# Patient Record
Sex: Male | Born: 1950 | Hispanic: No
Health system: Southern US, Community
[De-identification: ages and names within clinical notes are randomized; demographics above are authoritative.]

---

## 2020-08-30 ENCOUNTER — Other Ambulatory Visit: Payer: Self-pay

## 2020-08-30 ENCOUNTER — Inpatient Hospital Stay (HOSPITAL_BASED_OUTPATIENT_CLINIC_OR_DEPARTMENT_OTHER)
Admission: EM | Admit: 2020-08-30 | Discharge: 2020-09-01 | DRG: 641 | Disposition: A | Discharge status: Home / Self Care | Payer: Medicare Other | Attending: Internal Medicine | Admitting: Internal Medicine

## 2020-08-30 ENCOUNTER — Emergency Department (HOSPITAL_BASED_OUTPATIENT_CLINIC_OR_DEPARTMENT_OTHER): Payer: Medicare Other

## 2020-08-30 DIAGNOSIS — E861 Hypovolemia: Secondary | ICD-10-CM | POA: Diagnosis present

## 2020-08-30 DIAGNOSIS — E7800 Pure hypercholesterolemia, unspecified: Secondary | ICD-10-CM | POA: Diagnosis present

## 2020-08-30 DIAGNOSIS — E860 Dehydration: Principal | ICD-10-CM | POA: Diagnosis present

## 2020-08-30 DIAGNOSIS — E042 Nontoxic multinodular goiter: Secondary | ICD-10-CM | POA: Diagnosis not present

## 2020-08-30 DIAGNOSIS — Z8042 Family history of malignant neoplasm of prostate: Secondary | ICD-10-CM | POA: Diagnosis not present

## 2020-08-30 DIAGNOSIS — T464X5A Adverse effect of angiotensin-converting-enzyme inhibitors, initial encounter: Secondary | ICD-10-CM | POA: Diagnosis present

## 2020-08-30 DIAGNOSIS — E1122 Type 2 diabetes mellitus with diabetic chronic kidney disease: Secondary | ICD-10-CM | POA: Diagnosis not present

## 2020-08-30 DIAGNOSIS — Z20822 Contact with and (suspected) exposure to covid-19: Secondary | ICD-10-CM | POA: Diagnosis not present

## 2020-08-30 DIAGNOSIS — E785 Hyperlipidemia, unspecified: Secondary | ICD-10-CM

## 2020-08-30 DIAGNOSIS — Z8249 Family history of ischemic heart disease and other diseases of the circulatory system: Secondary | ICD-10-CM

## 2020-08-30 DIAGNOSIS — M5416 Radiculopathy, lumbar region: Secondary | ICD-10-CM | POA: Diagnosis present

## 2020-08-30 DIAGNOSIS — M48061 Spinal stenosis, lumbar region without neurogenic claudication: Secondary | ICD-10-CM | POA: Diagnosis present

## 2020-08-30 DIAGNOSIS — R55 Syncope and collapse: Secondary | ICD-10-CM | POA: Diagnosis present

## 2020-08-30 DIAGNOSIS — M79605 Pain in left leg: Secondary | ICD-10-CM | POA: Diagnosis not present

## 2020-08-30 DIAGNOSIS — N1831 Chronic kidney disease, stage 3a: Secondary | ICD-10-CM | POA: Diagnosis present

## 2020-08-30 DIAGNOSIS — R52 Pain, unspecified: Secondary | ICD-10-CM

## 2020-08-30 DIAGNOSIS — M79604 Pain in right leg: Secondary | ICD-10-CM | POA: Diagnosis present

## 2020-08-30 DIAGNOSIS — N400 Enlarged prostate without lower urinary tract symptoms: Secondary | ICD-10-CM | POA: Diagnosis not present

## 2020-08-30 DIAGNOSIS — Z7901 Long term (current) use of anticoagulants: Secondary | ICD-10-CM

## 2020-08-30 DIAGNOSIS — Z86718 Personal history of other venous thrombosis and embolism: Secondary | ICD-10-CM | POA: Diagnosis not present

## 2020-08-30 DIAGNOSIS — I129 Hypertensive chronic kidney disease with stage 1 through stage 4 chronic kidney disease, or unspecified chronic kidney disease: Secondary | ICD-10-CM | POA: Diagnosis present

## 2020-08-30 DIAGNOSIS — H538 Other visual disturbances: Secondary | ICD-10-CM | POA: Diagnosis present

## 2020-08-30 DIAGNOSIS — I10 Essential (primary) hypertension: Secondary | ICD-10-CM

## 2020-08-30 DIAGNOSIS — R748 Abnormal levels of other serum enzymes: Secondary | ICD-10-CM | POA: Diagnosis present

## 2020-08-30 DIAGNOSIS — E041 Nontoxic single thyroid nodule: Secondary | ICD-10-CM

## 2020-08-30 DIAGNOSIS — M5430 Sciatica, unspecified side: Secondary | ICD-10-CM | POA: Diagnosis not present

## 2020-08-30 DIAGNOSIS — E119 Type 2 diabetes mellitus without complications: Secondary | ICD-10-CM

## 2020-08-30 DIAGNOSIS — N179 Acute kidney failure, unspecified: Secondary | ICD-10-CM

## 2020-08-30 DIAGNOSIS — N189 Chronic kidney disease, unspecified: Secondary | ICD-10-CM

## 2020-08-30 DIAGNOSIS — R252 Cramp and spasm: Secondary | ICD-10-CM | POA: Diagnosis present

## 2020-08-30 LAB — CBC WITH DIFFERENTIAL/PLATELET
Abs Immature Granulocytes: 0.01 10*3/uL (ref 0.00–0.07)
Basophils Absolute: 0 10*3/uL (ref 0.0–0.1)
Basophils Relative: 1 %
Eosinophils Absolute: 0.1 10*3/uL (ref 0.0–0.5)
Eosinophils Relative: 2 %
HCT: 40.5 % (ref 39.0–52.0)
Hemoglobin: 13.3 g/dL (ref 13.0–17.0)
Immature Granulocytes: 0 %
Lymphocytes Relative: 15 %
Lymphs Abs: 0.8 10*3/uL (ref 0.7–4.0)
MCH: 27 pg (ref 26.0–34.0)
MCHC: 32.8 g/dL (ref 30.0–36.0)
MCV: 82.3 fL (ref 80.0–100.0)
Monocytes Absolute: 0.5 10*3/uL (ref 0.1–1.0)
Monocytes Relative: 9 %
Neutro Abs: 4.1 10*3/uL (ref 1.7–7.7)
Neutrophils Relative %: 73 %
Platelets: 226 10*3/uL (ref 150–400)
RBC: 4.92 MIL/uL (ref 4.22–5.81)
RDW: 14.2 % (ref 11.5–15.5)
WBC: 5.5 10*3/uL (ref 4.0–10.5)
nRBC: 0 % (ref 0.0–0.2)

## 2020-08-30 LAB — TROPONIN I (HIGH SENSITIVITY)
Troponin I (High Sensitivity): 5 ng/L (ref <18)
Troponin I (High Sensitivity): 5 ng/L (ref <18)

## 2020-08-30 LAB — COMPREHENSIVE METABOLIC PANEL
ALT: 20 U/L (ref 0–44)
AST: 24 U/L (ref 15–41)
Albumin: 4.6 g/dL (ref 3.5–5.0)
Alkaline Phosphatase: 56 U/L (ref 38–126)
Anion gap: 10 (ref 5–15)
BUN: 39 mg/dL — ABNORMAL HIGH (ref 8–23)
CO2: 27 mmol/L (ref 22–32)
Calcium: 10.3 mg/dL (ref 8.9–10.3)
Chloride: 98 mmol/L (ref 98–111)
Creatinine, Ser: 2.68 mg/dL — ABNORMAL HIGH (ref 0.61–1.24)
GFR, Estimated: 25 mL/min — ABNORMAL LOW (ref >60)
Glucose, Bld: 115 mg/dL — ABNORMAL HIGH (ref 70–99)
Potassium: 4.8 mmol/L (ref 3.5–5.1)
Sodium: 135 mmol/L (ref 135–145)
Total Bilirubin: 0.7 mg/dL (ref 0.3–1.2)
Total Protein: 7.5 g/dL (ref 6.5–8.1)

## 2020-08-30 LAB — LACTIC ACID, PLASMA
Lactic Acid, Venous: 1.2 mmol/L (ref 0.5–1.9)
Lactic Acid, Venous: 2.6 mmol/L (ref 0.5–1.9)

## 2020-08-30 LAB — URINALYSIS, ROUTINE W REFLEX MICROSCOPIC
Bilirubin Urine: NEGATIVE
Glucose, UA: NEGATIVE mg/dL
Hgb urine dipstick: NEGATIVE
Ketones, ur: NEGATIVE mg/dL
Leukocytes,Ua: NEGATIVE
Nitrite: NEGATIVE
Protein, ur: 30 mg/dL — AB
Specific Gravity, Urine: 1.025 (ref 1.005–1.030)
pH: 5 (ref 5.0–8.0)

## 2020-08-30 LAB — RESP PANEL BY RT-PCR (FLU A&B, COVID) ARPGX2
Influenza A by PCR: NEGATIVE
Influenza B by PCR: NEGATIVE
SARS Coronavirus 2 by RT PCR: NEGATIVE

## 2020-08-30 LAB — LIPASE, BLOOD: Lipase: 31 U/L (ref 11–51)

## 2020-08-30 LAB — MAGNESIUM: Magnesium: 1.9 mg/dL (ref 1.7–2.4)

## 2020-08-30 LAB — CK: Total CK: 912 U/L — ABNORMAL HIGH (ref 49–397)

## 2020-08-30 MED ORDER — SODIUM CHLORIDE 0.9 % IV BOLUS
1000.0000 mL | Freq: Once | INTRAVENOUS | Status: AC
  Administered 2020-08-30: 1000 mL via INTRAVENOUS

## 2020-08-30 MED ORDER — SODIUM CHLORIDE 0.9 % IV SOLN: Freq: Once | INTRAVENOUS | Status: AC

## 2020-08-30 NOTE — ED Triage Notes (Signed)
He states he has had multiple "back injections" for back pain, the most recent of which was ~ 3 weeks ago. He states his back pain is better, but now he is having bilat. Leg "cramps". He also states he had a brief possible syncope today which sitting in his chair. He is in no distress.

## 2020-08-30 NOTE — H&P (Signed)
History and Physical    Calvin Harris UXL:[id number] DOB: [birth year] DOA: 08/30/2020  PCP: Pcp, No Patient coming from: [facility]  Chief Complaint: Syncope  HPI: Calvin Harris is a 70 y.o. male with medical history significant of hypertension, hyperlipidemia, history of DVT on Eliquis, type II diabetes, CKD stage III AAA, lumbar radiculopathy, BPH, erectile dysfunction presented to the ED after a witnessed syncopal event with no prodrome.  Also complained of cramping in his abdomen and legs.  In the ED, vital signs stable.  Labs showing WBC 5.5, hemoglobin 13.3, platelet count 226K.  Sodium 135, potassium 4.8, chloride 98, bicarb 27, BUN 39, creatinine 2.6, glucose 115.  Lipase and LFTs normal.  Magnesium 1.9.  EKG showing sinus rhythm.  High-sensitivity troponin negative x2.  CK 912.  UA not suggestive of infection.  Lactic acid 1.2.  COVID and influenza PCR negative.  Head CT negative for acute finding.  CT chest/abdomen/pelvis negative for acute finding. Patient was given 1 L normal saline bolus.  Patient states he has had on and off episodes of dizziness and blurred vision for several months.  He thinks it is due to him not drinking enough fluids.  Today he felt dizzy all day and while at a family gathering he felt like he was going to blackout and had to sit down.  He does not remember what happened but was told by family that after sitting down on a chair he was just staring and then became unresponsive.  He denies any preceding shortness of breath or chest pain.  Denies history of seizures or stroke.  He is also complaining of ongoing cramps in his lower abdomen and upper thighs for several months.  States he has problems with his lower back including sciatica and spinal stenosis for which he gets injections.  Sometimes his legs cramp when he changes position but sometimes even at rest.  The cramping is mostly in his upper thighs but sometimes he feels it in his calves as well.  He has  noticed that during sexual activity specially after he orgasms he feels cramping pain in his upper inner thighs.  Denies history of peripheral arterial disease.  Does report history of prior DVTs in his right leg for which he is on Eliquis and compliant.  No swelling of his legs.  Review of Systems:  All systems reviewed and apart from history of presenting illness, are negative.  Past medical history: See HPI.  Past surgical history: Appendectomy in 2004, prostate surgery for BPH  Social history: Drinks 2 shots of liquor per week.  No drug or tobacco use.  Family history: Heart disease (mother), lung and prostate cancer (father)  Allergies: NKDA  Prior to Admission medications   Not on File    Physical Exam: Vitals:   08/30/20 2115 08/30/20 2125 08/30/20 2130 08/30/20 2244  BP: 137/86  131/83 126/82  Pulse: 71  73 81  Resp: 16  14 16   Temp:   98.2 F (36.8 C) 98 F (36.7 C)  TempSrc:   Oral Oral  SpO2: 100%  97% 97%  Weight:  92.5 kg    Height:  5\' 11"  (1.803 m)      Physical Exam Constitutional:      General: He is not in acute distress. HENT:     Head: Normocephalic and atraumatic.  Eyes:     Extraocular Movements: Extraocular movements intact.     Conjunctiva/sclera: Conjunctivae normal.  Cardiovascular:     Rate and  Rhythm: Normal rate and regular rhythm.     Pulses: Normal pulses.  Pulmonary:     Effort: Pulmonary effort is normal. No respiratory distress.     Breath sounds: Normal breath sounds. No wheezing or rales.  Abdominal:     General: Bowel sounds are normal. There is no distension.     Palpations: Abdomen is soft.     Tenderness: There is no abdominal tenderness.  Musculoskeletal:        General: No swelling or tenderness.     Cervical back: Normal range of motion and neck supple.  Skin:    General: Skin is warm and dry.  Neurological:     General: No focal deficit present.     Mental Status: He is alert and oriented to person, place, and time.      Labs on Admission: I have personally reviewed following labs and imaging studies  CBC: Recent Labs  Lab 08/30/20 1740  WBC 5.5  NEUTROABS 4.1  HGB 13.3  HCT 40.5  MCV 82.3  PLT 226   Basic Metabolic Panel: Recent Labs  Lab 08/30/20 1740  NA 135  K 4.8  CL 98  CO2 27  GLUCOSE 115*  BUN 39*  CREATININE 2.68*  CALCIUM 10.3  MG 1.9   GFR: Estimated Creatinine Clearance: 29.8 mL/min (A) (by C-G formula based on SCr of 2.68 mg/dL (H)). Liver Function Tests: Recent Labs  Lab 08/30/20 1740  AST 24  ALT 20  ALKPHOS 56  BILITOT 0.7  PROT 7.5  ALBUMIN 4.6   Recent Labs  Lab 08/30/20 1740  LIPASE 31   No results for input(s): AMMONIA in the last 168 hours. Coagulation Profile: No results for input(s): INR, PROTIME in the last 168 hours. Cardiac Enzymes: Recent Labs  Lab 08/30/20 1740  CKTOTAL 912*   BNP (last 3 results) No results for input(s): PROBNP in the last 8760 hours. HbA1C: No results for input(s): HGBA1C in the last 72 hours. CBG: No results for input(s): GLUCAP in the last 168 hours. Lipid Profile: No results for input(s): CHOL, HDL, LDLCALC, TRIG, CHOLHDL, LDLDIRECT in the last 72 hours. Thyroid Function Tests: No results for input(s): TSH, T4TOTAL, FREET4, T3FREE, THYROIDAB in the last 72 hours. Anemia Panel: No results for input(s): VITAMINB12, FOLATE, FERRITIN, TIBC, IRON, RETICCTPCT in the last 72 hours. Urine analysis:    Component Value Date/Time   COLORURINE YELLOW 08/30/2020 1740   APPEARANCEUR HAZY (A) 08/30/2020 1740   LABSPEC 1.025 08/30/2020 1740   PHURINE 5.0 08/30/2020 1740   GLUCOSEU NEGATIVE 08/30/2020 1740   HGBUR NEGATIVE 08/30/2020 1740   BILIRUBINUR NEGATIVE 08/30/2020 1740   KETONESUR NEGATIVE 08/30/2020 1740   PROTEINUR 30 (A) 08/30/2020 1740   NITRITE NEGATIVE 08/30/2020 1740   LEUKOCYTESUR NEGATIVE 08/30/2020 1740    Radiological Exams on Admission: CT Head Wo Contrast  Result Date: 08/30/2020 CLINICAL  DATA:  Possible syncopal episode today. EXAM: CT HEAD WITHOUT CONTRAST TECHNIQUE: Contiguous axial images were obtained from the base of the skull through the vertex without intravenous contrast. COMPARISON:  None. FINDINGS: Brain: Mildly enlarged ventricles and cortical sulci. Mild patchy white matter low density in both cerebral hemispheres. No intracranial hemorrhage, mass lesion or CT evidence of acute infarction. Vascular: No hyperdense vessel or unexpected calcification. Skull: Normal. Negative for fracture or focal lesion. Sinuses/Orbits: Unremarkable. Other: Mild bilateral concha bullosa. IMPRESSION: 1. No acute abnormality. 2. Mild diffuse cerebral atrophy and mild chronic small vessel white matter ischemic changes in both cerebral hemispheres.  Electronically Signed   By: Beckie Salts M.D.   On: 08/30/2020 20:16   CT CHEST ABDOMEN PELVIS WO CONTRAST  Result Date: 08/30/2020 CLINICAL DATA:  Bilateral leg pain. Recent low back pain. Renal insufficiency. EXAM: CT CHEST, ABDOMEN AND PELVIS WITHOUT CONTRAST TECHNIQUE: Multidetector CT imaging of the chest, abdomen and pelvis was performed following the standard protocol without IV contrast. COMPARISON:  None. FINDINGS: CT CHEST FINDINGS Cardiovascular: Atheromatous calcifications, including the coronary arteries and aorta. Normal sized heart. No displaced intimal calcifications to indicate aortic dissection. No aneurysm. Mediastinum/Nodes: Mild to moderate diffuse enlargement of the thyroid gland with diffuse heterogeneity and nodularity. The largest discrete nodule is on the right measuring 1.7 cm in maximum diameter on image number 7/3. There is also a more confluent area of nodularity more inferiorly on the right, difficult to determine if there is a single nodule or confluence of smaller nodules measuring 3.4 cm in maximum diameter on image number 9/3. No enlarged lymph nodes.  Unremarkable esophagus. Lungs/Pleura: 1.0 cm calcified granuloma in the right  lower lobe on image number 76/5. No noncalcified nodules. No airspace consolidation or pleural fluid. Musculoskeletal: Thoracic spine degenerative changes. CT ABDOMEN PELVIS FINDINGS Hepatobiliary: Small left lobe liver calcified granuloma. Normal appearing gallbladder. Pancreas: Unremarkable. No pancreatic ductal dilatation or surrounding inflammatory changes. Spleen: Normal in size without focal abnormality. Adrenals/Urinary Tract: Normal appearing adrenal glands. Simple appearing upper pole right renal cyst. Normal appearing left kidney, ureters and urinary bladder with the exception of mild bladder wall thickening and enlarged prostate gland protruding into the base of the bladder. The urinary bladder is poorly distended. Stomach/Bowel: Unremarkable stomach, small bowel and colon. No evidence of appendicitis. Vascular/Lymphatic: Atheromatous arterial calcifications without aneurysm. No abdominal aortic aneurysm or displaced intimal calcifications to indicate dissection. No enlarged lymph nodes. Reproductive: Moderate to marked enlargement of the prostate gland with protrusion of the median lobe into the base of the urinary bladder. Other: Small bilateral inguinal hernias containing fat. Small umbilical hernia containing fat. Musculoskeletal: L5-S1 degenerative changes with Schmorl's node formation. Bilateral proximal femoral bone islands. IMPRESSION: 1. No acute abnormality. 2. Multinodular goiter with a possible 3.4 cm dominant nodule in the right lobe. Recommend thyroid US (ref: J Am Coll Radiol. 2015 Feb;12(2): 143-50). 3. 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 4. 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 5.  Calcific coronary artery and aortic atherosclerosis. Electronically Signed   By: Beckie Salts M.D.   On: 08/30/2020 20:15    EKG: Independently reviewed.  Sinus  rhythm.  Assessment/Plan Principal Problem:   Syncope Active Problems:   Acute-on-chronic kidney injury (HCC)   Hypertension   Hyperlipidemia   Diabetes (HCC)   Syncope Differentials include cardiogenic, orthostatic, vasovagal, carotid stenosis, or seizure activity.  EKG showing sinus rhythm.  High-sensitivity troponin negative x2.  Head CT negative for acute finding.  PE less likely given no tachycardia or hypoxia. -Cardiac monitoring, echocardiogram, EEG, bilateral carotid Dopplers, D-dimer, orthostatics.  Cramping in lower abdomen/thighs Does have history of lumbar radiculopathy but symptoms are more concerning for claudication.  CT negative for acute intra-abdominal finding. -Bilateral ABIs ordered.  Avoiding contrast arteriography in the setting of AKI.  Also hold statin given elevated CK.  AKI on CKD stage IIIa BUN 39, creatinine 2.6 (baseline 1.2).  Likely prerenal from dehydration.  Also CK slightly elevated. -IV fluid hydration and repeat BMP  in the morning.  Avoid nephrotoxic agents.  Order urinalysis.  Hypertension -Hold antihypertensives at this time, checking orthostatics  Hyperlipidemia -Hold statin given elevated CK  History of recurrent DVTs No clinical signs of DVT at this time. -Resume Eliquis after pharmacy med rec is done  Type 2 diabetes -Pharmacy med rec pending.  Check A1c.  Order sliding scale insulin sensitive ACHS.  BPH -Resume home med after pharmacy med rec is done.  Thyroid nodule Incidental CT finding of multinodular goiter with a possible 3.4 cm dominant nodule in the right lobe. -Thyroid ultrasound  DVT prophylaxis: Resume Eliquis after pharmacy med rec is done Code Status: Patient wishes to be full code. Family Communication: No family available at this time. Disposition Plan: Status is: Observation  The patient remains OBS appropriate and will d/c before 2 midnights.  Dispo: The patient is from: Home              Anticipated d/c is  to: Home              Patient currently is not medically stable to d/c.   Difficult to place patient No  Level of care: Level of care: Telemetry  The medical decision making on this patient was of high complexity and the patient is at high risk for clinical deterioration, therefore this is a level 3 visit.  John Giovanni MD [facility]  If 7PM-7AM, please contact night-coverage [email or web address]  08/31/2020, 12:41 AM

## 2020-08-30 NOTE — Progress Notes (Signed)
Received pt from [facility] to [facility] 6th floor. Pt a/o x 4, no signs of distress noted. PT ambulated from stretcher to bed. VSS. No c/o pain at this time. Oriented pt to room and the use of the call bell. All items placed in reach. Bed alarm initiated. Paged new admissions for further orders.

## 2020-08-30 NOTE — ED Provider Notes (Signed)
[facility] EMERGENCY DEPT Provider Note   CSN: [id number] Arrival date & time: 08/30/20  1627     History Chief Complaint  Patient presents with   Abdominal Pain    Calvin Harris is a 70 y.o. male.  Patient been having intermittent abdominal pain and cramping in going into his legs for the last several days.  Having fatigue and lightheadedness as well.  Was out of family reunion today out in the heat and was sitting down when he had loss of consciousness.  Did not feel it coming on.  Was witnessed.  He did not fall or hit his head.  He continues to have ongoing intermittent cramping in his stomach.  He is on a blood thinner.  The history is provided by the patient.  Abdominal Pain Pain location:  Generalized Pain quality: cramping   Pain radiates to:  L leg and R leg Pain severity:  Moderate Onset quality:  Gradual Duration:  5 days Timing:  Intermittent Progression:  Waxing and waning Chronicity:  New Context: not sick contacts   Relieved by:  Nothing Worsened by:  Nothing Associated symptoms: no chest pain, no chills, no cough, no dysuria, no fever, no hematuria, no shortness of breath, no sore throat and no vomiting       No past medical history on file.  Patient Active Problem List   Diagnosis Date Noted   Syncope 08/30/2020      No family history on file.     Home Medications Prior to Admission medications   Not on File    Allergies    Patient has no allergy information on record.  Review of Systems   Review of Systems  Constitutional:  Negative for chills and fever.  HENT:  Negative for ear pain and sore throat.   Eyes:  Negative for pain and visual disturbance.  Respiratory:  Negative for cough and shortness of breath.   Cardiovascular:  Negative for chest pain and palpitations.  Gastrointestinal:  Positive for abdominal pain. Negative for vomiting.  Genitourinary:  Negative for dysuria and hematuria.  Musculoskeletal:   Positive for myalgias. Negative for arthralgias and back pain.  Skin:  Negative for color change and rash.  Neurological:  Negative for seizures and syncope.  All other systems reviewed and are negative.  Physical Exam Updated Vital Signs BP 125/81   Pulse 71   Temp 97.8 F (36.6 C)   Resp 10   SpO2 99%   Physical Exam Vitals and nursing note reviewed.  Constitutional:      Appearance: He is well-developed.  HENT:     Head: Normocephalic and atraumatic.     Mouth/Throat:     Mouth: Mucous membranes are moist.  Eyes:     Extraocular Movements: Extraocular movements intact.     Conjunctiva/sclera: Conjunctivae normal.     Pupils: Pupils are equal, round, and reactive to light.  Cardiovascular:     Rate and Rhythm: Normal rate and regular rhythm.     Pulses:          Dorsalis pedis pulses are detected w/ Doppler on the right side and detected w/ Doppler on the left side.     Heart sounds: Normal heart sounds. No murmur heard.    Comments: Doppler pulses Pulmonary:     Effort: Pulmonary effort is normal. No respiratory distress.     Breath sounds: Normal breath sounds.  Abdominal:     General: Abdomen is flat.     Palpations:  Abdomen is soft.     Tenderness: There is no abdominal tenderness. There is no guarding or rebound.  Musculoskeletal:     Cervical back: Neck supple.  Skin:    General: Skin is warm and dry.     Capillary Refill: Capillary refill takes less than 2 seconds.  Neurological:     Mental Status: He is alert.  Psychiatric:        Mood and Affect: Mood normal.    ED Results / Procedures / Treatments   Labs (all labs ordered are listed, but only abnormal results are displayed) Labs Reviewed  COMPREHENSIVE METABOLIC PANEL - Abnormal; Notable for the following components:      Result Value   Glucose, Bld 115 (*)    BUN 39 (*)    Creatinine, Ser 2.68 (*)    GFR, Estimated 25 (*)    All other components within normal limits  CK - Abnormal; Notable for  the following components:   Total CK 912 (*)    All other components within normal limits  URINALYSIS, ROUTINE W REFLEX MICROSCOPIC - Abnormal; Notable for the following components:   APPearance HAZY (*)    Protein, ur 30 (*)    All other components within normal limits  RESP PANEL BY RT-PCR (FLU A&B, COVID) ARPGX2  CBC WITH DIFFERENTIAL/PLATELET  MAGNESIUM  LIPASE, BLOOD  LACTIC ACID, PLASMA  LACTIC ACID, PLASMA  TROPONIN I (HIGH SENSITIVITY)  TROPONIN I (HIGH SENSITIVITY)    EKG EKG Interpretation  Date/Time:  Saturday August 30 2020 17:17:48 EDT Ventricular Rate:  81 PR Interval:  140 QRS Duration: 86 QT Interval:  351 QTC Calculation: 408 R Axis:   71 Text Interpretation: Sinus rhythm Confirmed by Virgina Norfolk ([id number]) on 08/30/2020 5:19:24 PM  Radiology CT Head Wo Contrast  Result Date: 08/30/2020 CLINICAL DATA:  Possible syncopal episode today. EXAM: CT HEAD WITHOUT CONTRAST TECHNIQUE: Contiguous axial images were obtained from the base of the skull through the vertex without intravenous contrast. COMPARISON:  None. FINDINGS: Brain: Mildly enlarged ventricles and cortical sulci. Mild patchy white matter low density in both cerebral hemispheres. No intracranial hemorrhage, mass lesion or CT evidence of acute infarction. Vascular: No hyperdense vessel or unexpected calcification. Skull: Normal. Negative for fracture or focal lesion. Sinuses/Orbits: Unremarkable. Other: Mild bilateral concha bullosa. IMPRESSION: 1. No acute abnormality. 2. Mild diffuse cerebral atrophy and mild chronic small vessel white matter ischemic changes in both cerebral hemispheres. Electronically Signed   By: Beckie Salts M.D.   On: 08/30/2020 20:16   CT CHEST ABDOMEN PELVIS WO CONTRAST  Result Date: 08/30/2020 CLINICAL DATA:  Bilateral leg pain. Recent low back pain. Renal insufficiency. EXAM: CT CHEST, ABDOMEN AND PELVIS WITHOUT CONTRAST TECHNIQUE: Multidetector CT imaging of the chest, abdomen and pelvis  was performed following the standard protocol without IV contrast. COMPARISON:  None. FINDINGS: CT CHEST FINDINGS Cardiovascular: Atheromatous calcifications, including the coronary arteries and aorta. Normal sized heart. No displaced intimal calcifications to indicate aortic dissection. No aneurysm. Mediastinum/Nodes: Mild to moderate diffuse enlargement of the thyroid gland with diffuse heterogeneity and nodularity. The largest discrete nodule is on the right measuring 1.7 cm in maximum diameter on image number 7/3. There is also a more confluent area of nodularity more inferiorly on the right, difficult to determine if there is a single nodule or confluence of smaller nodules measuring 3.4 cm in maximum diameter on image number 9/3. No enlarged lymph nodes.  Unremarkable esophagus. Lungs/Pleura: 1.0 cm calcified granuloma in the right  lower lobe on image number 76/5. No noncalcified nodules. No airspace consolidation or pleural fluid. Musculoskeletal: Thoracic spine degenerative changes. CT ABDOMEN PELVIS FINDINGS Hepatobiliary: Small left lobe liver calcified granuloma. Normal appearing gallbladder. Pancreas: Unremarkable. No pancreatic ductal dilatation or surrounding inflammatory changes. Spleen: Normal in size without focal abnormality. Adrenals/Urinary Tract: Normal appearing adrenal glands. Simple appearing upper pole right renal cyst. Normal appearing left kidney, ureters and urinary bladder with the exception of mild bladder wall thickening and enlarged prostate gland protruding into the base of the bladder. The urinary bladder is poorly distended. Stomach/Bowel: Unremarkable stomach, small bowel and colon. No evidence of appendicitis. Vascular/Lymphatic: Atheromatous arterial calcifications without aneurysm. No abdominal aortic aneurysm or displaced intimal calcifications to indicate dissection. No enlarged lymph nodes. Reproductive: Moderate to marked enlargement of the prostate gland with protrusion of  the median lobe into the base of the urinary bladder. Other: Small bilateral inguinal hernias containing fat. Small umbilical hernia containing fat. Musculoskeletal: L5-S1 degenerative changes with Schmorl's node formation. Bilateral proximal femoral bone islands. IMPRESSION: 1. No acute abnormality. 2. Multinodular goiter with a possible 3.4 cm dominant nodule in the right lobe. Recommend thyroid US (ref: J Am Coll Radiol. 2015 Feb;12(2): 143-50). 3. 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 4. 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 5.  Calcific coronary artery and aortic atherosclerosis. Electronically Signed   By: Beckie SaltsSteven  Reid M.D.   On: 08/30/2020 20:15    Procedures Procedures   Medications Ordered in ED Medications  sodium chloride 0.9 % bolus 1,000 mL (0 mLs Intravenous Stopped 08/30/20 2047)    ED Course  I have reviewed the triage vital signs and the nursing notes.  Pertinent labs & imaging results that were available during my care of the patient were reviewed by me and considered in my medical decision making (see chart for details).    MDM Rules/Calculators/A&P                          Calvin Harris is here after syncopal event.  Normal vitals.  No fever.  Had a witnessed syncopal event with no prodrome.  He was sitting on the chair seemed to lose consciousness no seizure activity.  He states that he has been having some intermittent cramping in his belly into his thighs last several days with some lightheaded episodes.  He denies any fevers or chills.  Denies any melena or hematochezia.  Denies any nausea or vomiting or chest pain.  He is having abdominal cramping specifically with movement.  He has Doppler pulses in his lower extremities.  Denies any pain with urination.  Is on Eliquis for DVT.  Has a history of high cholesterol hypertension  and diabetes.  Overall we will work-up syncope with neurogenic and cardiogenic work-up.  We will get a CT scan of his chest abdomen pelvis as well looking for any infectious process.  Patient with significant AKI with a creatinine of 2.68.  Otherwise lab work is unremarkable including 2 troponins.  No significant electrolyte abnormality otherwise.  Normal hemoglobin.  EKG shows sinus rhythm.  Was unable to get a contrast CT scan but Noncon CT scan of his head, chest, abdomen, pelvis is overall unremarkable.  Urinalysis negative for infection.  Overall given his syncope will admit him for further work-up and hydration especially given AKI.  Admitted  to the hospitalist.  This chart was dictated using voice recognition software.  Despite best efforts to proofread,  errors can occur which can change the documentation meaning.   Final Clinical Impression(s) / ED Diagnoses Final diagnoses:  Pain  AKI (acute kidney injury) (HCC)  Syncope and collapse    Rx / DC Orders ED Discharge Orders     None        Virgina Norfolk, DO 08/30/20 2111

## 2020-08-31 ENCOUNTER — Observation Stay (HOSPITAL_BASED_OUTPATIENT_CLINIC_OR_DEPARTMENT_OTHER): Payer: Medicare Other

## 2020-08-31 ENCOUNTER — Observation Stay (HOSPITAL_COMMUNITY): Payer: Medicare Other

## 2020-08-31 DIAGNOSIS — M79605 Pain in left leg: Secondary | ICD-10-CM | POA: Diagnosis present

## 2020-08-31 DIAGNOSIS — R55 Syncope and collapse: Secondary | ICD-10-CM

## 2020-08-31 DIAGNOSIS — M5416 Radiculopathy, lumbar region: Secondary | ICD-10-CM | POA: Diagnosis present

## 2020-08-31 DIAGNOSIS — T464X5A Adverse effect of angiotensin-converting-enzyme inhibitors, initial encounter: Secondary | ICD-10-CM | POA: Diagnosis present

## 2020-08-31 DIAGNOSIS — I10 Essential (primary) hypertension: Secondary | ICD-10-CM | POA: Diagnosis not present

## 2020-08-31 DIAGNOSIS — N189 Chronic kidney disease, unspecified: Secondary | ICD-10-CM

## 2020-08-31 DIAGNOSIS — Z86718 Personal history of other venous thrombosis and embolism: Secondary | ICD-10-CM | POA: Diagnosis not present

## 2020-08-31 DIAGNOSIS — I361 Nonrheumatic tricuspid (valve) insufficiency: Secondary | ICD-10-CM

## 2020-08-31 DIAGNOSIS — N1831 Chronic kidney disease, stage 3a: Secondary | ICD-10-CM

## 2020-08-31 DIAGNOSIS — E1122 Type 2 diabetes mellitus with diabetic chronic kidney disease: Secondary | ICD-10-CM | POA: Diagnosis present

## 2020-08-31 DIAGNOSIS — N179 Acute kidney failure, unspecified: Secondary | ICD-10-CM

## 2020-08-31 DIAGNOSIS — E7800 Pure hypercholesterolemia, unspecified: Secondary | ICD-10-CM | POA: Diagnosis present

## 2020-08-31 DIAGNOSIS — E860 Dehydration: Secondary | ICD-10-CM | POA: Diagnosis present

## 2020-08-31 DIAGNOSIS — I129 Hypertensive chronic kidney disease with stage 1 through stage 4 chronic kidney disease, or unspecified chronic kidney disease: Secondary | ICD-10-CM | POA: Diagnosis present

## 2020-08-31 DIAGNOSIS — R748 Abnormal levels of other serum enzymes: Secondary | ICD-10-CM | POA: Diagnosis present

## 2020-08-31 DIAGNOSIS — Z8042 Family history of malignant neoplasm of prostate: Secondary | ICD-10-CM | POA: Diagnosis not present

## 2020-08-31 DIAGNOSIS — E042 Nontoxic multinodular goiter: Secondary | ICD-10-CM | POA: Diagnosis present

## 2020-08-31 DIAGNOSIS — N400 Enlarged prostate without lower urinary tract symptoms: Secondary | ICD-10-CM | POA: Diagnosis present

## 2020-08-31 DIAGNOSIS — E119 Type 2 diabetes mellitus without complications: Secondary | ICD-10-CM

## 2020-08-31 DIAGNOSIS — Z20822 Contact with and (suspected) exposure to covid-19: Secondary | ICD-10-CM | POA: Diagnosis present

## 2020-08-31 DIAGNOSIS — M5430 Sciatica, unspecified side: Secondary | ICD-10-CM | POA: Diagnosis present

## 2020-08-31 DIAGNOSIS — H538 Other visual disturbances: Secondary | ICD-10-CM | POA: Diagnosis present

## 2020-08-31 DIAGNOSIS — E785 Hyperlipidemia, unspecified: Secondary | ICD-10-CM

## 2020-08-31 DIAGNOSIS — E041 Nontoxic single thyroid nodule: Secondary | ICD-10-CM

## 2020-08-31 DIAGNOSIS — E861 Hypovolemia: Secondary | ICD-10-CM | POA: Diagnosis present

## 2020-08-31 DIAGNOSIS — Z7901 Long term (current) use of anticoagulants: Secondary | ICD-10-CM | POA: Diagnosis not present

## 2020-08-31 DIAGNOSIS — Z8249 Family history of ischemic heart disease and other diseases of the circulatory system: Secondary | ICD-10-CM | POA: Diagnosis not present

## 2020-08-31 DIAGNOSIS — M79604 Pain in right leg: Secondary | ICD-10-CM | POA: Diagnosis present

## 2020-08-31 DIAGNOSIS — M48061 Spinal stenosis, lumbar region without neurogenic claudication: Secondary | ICD-10-CM | POA: Diagnosis present

## 2020-08-31 LAB — ECHOCARDIOGRAM COMPLETE
AR max vel: 2.8 cm2
AV Area VTI: 3.19 cm2
AV Area mean vel: 2.67 cm2
AV Mean grad: 5 mmHg
AV Peak grad: 10.1 mmHg
Ao pk vel: 1.59 m/s
Area-P 1/2: 3.54 cm2
Calc EF: 53.4 %
Height: 71 in
S' Lateral: 2.7 cm
Single Plane A2C EF: 56.8 %
Single Plane A4C EF: 53.2 %
Weight: 3276.92 oz

## 2020-08-31 LAB — BASIC METABOLIC PANEL
Anion gap: 6 (ref 5–15)
BUN: 35 mg/dL — ABNORMAL HIGH (ref 8–23)
CO2: 27 mmol/L (ref 22–32)
Calcium: 9.4 mg/dL (ref 8.9–10.3)
Chloride: 106 mmol/L (ref 98–111)
Creatinine, Ser: 1.83 mg/dL — ABNORMAL HIGH (ref 0.61–1.24)
GFR, Estimated: 39 mL/min — ABNORMAL LOW (ref >60)
Glucose, Bld: 105 mg/dL — ABNORMAL HIGH (ref 70–99)
Potassium: 4.7 mmol/L (ref 3.5–5.1)
Sodium: 139 mmol/L (ref 135–145)

## 2020-08-31 LAB — HEMOGLOBIN A1C
Hgb A1c MFr Bld: 7.6 % — ABNORMAL HIGH (ref 4.8–5.6)
Mean Plasma Glucose: 171.42 mg/dL

## 2020-08-31 LAB — GLUCOSE, CAPILLARY
Glucose-Capillary: 158 mg/dL — ABNORMAL HIGH (ref 70–99)
Glucose-Capillary: 104 mg/dL — ABNORMAL HIGH (ref 70–99)
Glucose-Capillary: 135 mg/dL — ABNORMAL HIGH (ref 70–99)
Glucose-Capillary: 123 mg/dL — ABNORMAL HIGH (ref 70–99)
Glucose-Capillary: 153 mg/dL — ABNORMAL HIGH (ref 70–99)

## 2020-08-31 LAB — D-DIMER, QUANTITATIVE: D-Dimer, Quant: 0.34 ug/mL-FEU (ref 0.00–0.50)

## 2020-08-31 LAB — HIV ANTIBODY (ROUTINE TESTING W REFLEX): HIV Screen 4th Generation wRfx: NONREACTIVE

## 2020-08-31 MED ORDER — SODIUM CHLORIDE 0.9 % IV SOLN: INTRAVENOUS | Status: DC

## 2020-08-31 MED ORDER — APIXABAN 2.5 MG PO TABS
2.5000 mg | ORAL_TABLET | Freq: Two times a day (BID) | ORAL | Status: DC
  Administered 2020-08-31 – 2020-09-01 (×2): 2.5 mg via ORAL
  Filled 2020-08-31 (×2): qty 1

## 2020-08-31 MED ORDER — ACETAMINOPHEN 650 MG RE SUPP: 650.0000 mg | Freq: Four times a day (QID) | RECTAL | Status: DC | PRN

## 2020-08-31 MED ORDER — SODIUM CHLORIDE 0.9 % IV BOLUS
500.0000 mL | Freq: Once | INTRAVENOUS | Status: AC
  Administered 2020-08-31: 500 mL via INTRAVENOUS

## 2020-08-31 MED ORDER — ACETAMINOPHEN 325 MG PO TABS: 650.0000 mg | ORAL_TABLET | Freq: Four times a day (QID) | ORAL | Status: DC | PRN

## 2020-08-31 MED ORDER — TAMSULOSIN HCL 0.4 MG PO CAPS
0.8000 mg | ORAL_CAPSULE | Freq: Every day | ORAL | Status: DC
  Administered 2020-08-31: 0.8 mg via ORAL
  Filled 2020-08-31: qty 2

## 2020-08-31 MED ORDER — APIXABAN 2.5 MG PO TABS: 2.5000 mg | ORAL_TABLET | Freq: Two times a day (BID) | ORAL | Status: DC

## 2020-08-31 MED ORDER — INSULIN ASPART 100 UNIT/ML IJ SOLN
0.0000 [IU] | Freq: Three times a day (TID) | INTRAMUSCULAR | Status: DC
  Administered 2020-08-31 (×2): 1 [IU] via SUBCUTANEOUS

## 2020-08-31 MED ORDER — ASPIRIN 81 MG PO CHEW
81.0000 mg | CHEWABLE_TABLET | Freq: Every day | ORAL | Status: DC
  Administered 2020-09-01: 81 mg via ORAL
  Filled 2020-08-31: qty 1

## 2020-08-31 MED ORDER — INSULIN ASPART 100 UNIT/ML IJ SOLN: 0.0000 [IU] | Freq: Every day | INTRAMUSCULAR | Status: DC

## 2020-08-31 NOTE — Progress Notes (Addendum)
Carotid duplex and ABI has been completed.   Preliminary results in CV Proc.   Blanch Media 08/31/2020 9:07 AM

## 2020-08-31 NOTE — Progress Notes (Deleted)
Pt in MRI.

## 2020-08-31 NOTE — Progress Notes (Signed)
PROGRESS NOTE    Calvin Harris  ZOX:[id number] DOB: [birth year] DOA: 08/30/2020 PCP: Pcp, No    Chief Complaint  Patient presents with   Abdominal Pain    Brief Narrative:  HPI per Dr. Mauri Brooklyn is a 70 y.o. male with medical history significant of hypertension, hyperlipidemia, history of DVT on Eliquis, type II diabetes, CKD stage III AAA, lumbar radiculopathy, BPH, erectile dysfunction presented to the ED after a witnessed syncopal event with no prodrome.  Also complained of cramping in his abdomen and legs.  In the ED, vital signs stable.  Labs showing WBC 5.5, hemoglobin 13.3, platelet count 226K.  Sodium 135, potassium 4.8, chloride 98, bicarb 27, BUN 39, creatinine 2.6, glucose 115.  Lipase and LFTs normal.  Magnesium 1.9.  EKG showing sinus rhythm.  High-sensitivity troponin negative x2.  CK 912.  UA not suggestive of infection.  Lactic acid 1.2.  COVID and influenza PCR negative.  Head CT negative for acute finding.  CT chest/abdomen/pelvis negative for acute finding. Patient was given 1 L normal saline bolus.   Patient states he has had on and off episodes of dizziness and blurred vision for several months.  He thinks it is due to him not drinking enough fluids.  Today he felt dizzy all day and while at a family gathering he felt like he was going to blackout and had to sit down.  He does not remember what happened but was told by family that after sitting down on a chair he was just staring and then became unresponsive.  He denies any preceding shortness of breath or chest pain.  Denies history of seizures or stroke.  He is also complaining of ongoing cramps in his lower abdomen and upper thighs for several months.  States he has problems with his lower back including sciatica and spinal stenosis for which he gets injections.  Sometimes his legs cramp when he changes position but sometimes even at rest.  The cramping is mostly in his upper thighs but sometimes he feels it  in his calves as well.  He has noticed that during sexual activity specially after he orgasms he feels cramping pain in his upper inner thighs.  Denies history of peripheral arterial disease.  Does report history of prior DVTs in his right leg for which he is on Eliquis and compliant.  No swelling of his legs.  Assessment & Plan:   Principal Problem:   Syncope Active Problems:   Acute-on-chronic kidney injury (HCC)   Hypertension   Hyperlipidemia   Diabetes (HCC)  #1 syncope -Likely secondary to hypovolemia leading to hypoperfusion from dehydration.  Patient noted to have soft blood pressure on admission with systolics in the mid 90s.  Patient also noted to be in acute kidney injury with elevated BUN/creatinine. -Orthostatics were checked however patient had been started on IV fluids and orthostatics were negative. -Patient denies any bowel or urinary incontinence seizure likely low on the differential. -CT head and MRI head negative for any acute abnormalities. -Carotid Dopplers with no significant ICA stenosis. -2D echo pending. -Patient with no signs or symptoms of infection. -Continue fluid resuscitation with IV fluids. -Case discussed with neurology and is felt seizure likely low on the differential and as such discontinue EEG. -If the rest of the work-up is negative patient may benefit from outpatient event monitor.  2.  Acute renal failure on chronic kidney disease stage IIIa -Likely secondary to a prerenal azotemia in the setting of lisinopril  HCTZ. -Last creatinine from care everywhere was 1.26 on 12/19/2019 and 1.44 on 11/14/2019. -Urinalysis nitrite negative, leukocytes negative, 30 protein. -CT abdomen and pelvis negative for hydronephrosis. -Renal function improving creatinine down to 1.83 from 2.68 on admission. -Continue IV fluid resuscitation. -Follow.  3.  Cramping in lower abdomen/thighs -Patient with a history of lumbar radiculopathy. -CT abdomen and pelvis  negative for any intra-abdominal finding. -Bilateral ABIs within normal range. -Hold statin.  4.  Hypertension -Continue to hold antihypertensive medications.  5.  BPH -Resume home regimen Flomax.  6.  Hyperlipidemia -Hold statin secondary to elevated CK and cramping in lower abdomen and thighs.  7.  History of recurrent DVTs -Resume Eliquis.  8.  Diabetes mellitus type 2 -Hemoglobin A1c 7.6. -CBG 104 this morning. -Hold oral hypoglycemic agents. -SSI.  9.  Multinodular goiter -Incidental finding noted on CT chest. -Thyroid ultrasound with 3 cm mid isthmus nodule, 3.4 cm right inferior nodule meeting criteria for biopsy. -Outpatient follow-up for FNA and further evaluation.   DVT prophylaxis: Eliquis Code Status: Full Family Communication: Updated patient, wife and son at bedside Disposition:   Status is: Inpatient  The patient will require care spanning > 2 midnights and should be moved to inpatient because: IV treatments appropriate due to intensity of illness or inability to take PO  Dispo: The patient is from: Home              Anticipated d/c is to: Home              Patient currently is not medically stable to d/c.   Difficult to place patient No       Consultants:  Curb sided neurology 08/31/2020  Procedures:  CT head 08/30/2020 MRI head 08/31/2020 2D echo 08/31/2020 Carotid Dopplers 08/31/2020 ABI with TBI 08/31/2020 CT chest abdomen and pelvis 08/30/2020 Thyroid ultrasound 08/31/2020     Antimicrobials:  None   Subjective: Patient laying in bed.  Denies any chest pain.  No shortness of breath.  No abdominal pain.  No syncopal episodes noted.  No arrhythmias noted on telemetry.  Objective: Vitals:   08/31/20 0123 08/31/20 0127 08/31/20 0131 08/31/20 0614  BP: 122/78   119/61  Pulse: 73   65  Resp: 16   16  Temp: 98.1 F (36.7 C)   98.3 F (36.8 C)  TempSrc: Oral   Oral  SpO2: 97% 98% 96% 99%  Weight:      Height:       No intake or  output data in the 24 hours ending 08/31/20 1246 Filed Weights   08/30/20 2125 08/30/20 2249  Weight: 92.5 kg 92.9 kg    Examination:  General exam: Appears calm and comfortable  Respiratory system: Clear to auscultation. Respiratory effort normal. Cardiovascular system: S1 & S2 heard, RRR. No JVD, murmurs, rubs, gallops or clicks. No pedal edema. Gastrointestinal system: Abdomen is nondistended, soft and nontender. No organomegaly or masses felt. Normal bowel sounds heard. Central nervous system: Alert and oriented. No focal neurological deficits. Extremities: Symmetric 5 x 5 power. Skin: No rashes, lesions or ulcers Psychiatry: Judgement and insight appear normal. Mood & affect appropriate.     Data Reviewed: I have personally reviewed following labs and imaging studies  CBC: Recent Labs  Lab 08/30/20 1740  WBC 5.5  NEUTROABS 4.1  HGB 13.3  HCT 40.5  MCV 82.3  PLT 226    Basic Metabolic Panel: Recent Labs  Lab 08/30/20 1740 08/31/20 0543  NA 135 139  K  4.8 4.7  CL 98 106  CO2 27 27  GLUCOSE 115* 105*  BUN 39* 35*  CREATININE 2.68* 1.83*  CALCIUM 10.3 9.4  MG 1.9  --     GFR: Estimated Creatinine Clearance: 43.7 mL/min (A) (by C-G formula based on SCr of 1.83 mg/dL (H)).  Liver Function Tests: Recent Labs  Lab 08/30/20 1740  AST 24  ALT 20  ALKPHOS 56  BILITOT 0.7  PROT 7.5  ALBUMIN 4.6    CBG: Recent Labs  Lab 08/31/20 0125 08/31/20 0838  GLUCAP 158* 104*     Recent Results (from the past 240 hour(s))  Resp Panel by RT-PCR (Flu A&B, Covid) Nasopharyngeal Swab     Status: None   Collection Time: 08/30/20  9:10 PM   Specimen: Nasopharyngeal Swab; Nasopharyngeal(NP) swabs in vial transport medium  Result Value Ref Range Status   SARS Coronavirus 2 by RT PCR NEGATIVE NEGATIVE Final    Comment: (NOTE) SARS-CoV-2 target nucleic acids are NOT DETECTED.  The SARS-CoV-2 RNA is generally detectable in upper respiratory specimens during the  acute phase of infection. The lowest concentration of SARS-CoV-2 viral copies this assay can detect is 138 copies/mL. A negative result does not preclude SARS-Cov-2 infection and should not be used as the sole basis for treatment or other patient management decisions. A negative result may occur with  improper specimen collection/handling, submission of specimen other than nasopharyngeal swab, presence of viral mutation(s) within the areas targeted by this assay, and inadequate number of viral copies(<138 copies/mL). A negative result must be combined with clinical observations, patient history, and epidemiological information. The expected result is Negative.  Fact Sheet for Patients:  BloggerCourse.com  Fact Sheet for Healthcare Providers:  SeriousBroker.it  This test is no t yet approved or cleared by the [place] FDA and  has been authorized for detection and/or diagnosis of SARS-CoV-2 by FDA under an Emergency Use Authorization (EUA). This EUA will remain  in effect (meaning this test can be used) for the duration of the COVID-19 declaration under Section 564(b)(1) of the Act, 21 U.S.C.section 360bbb-3(b)(1), unless the authorization is terminated  or revoked sooner.       Influenza A by PCR NEGATIVE NEGATIVE Final   Influenza B by PCR NEGATIVE NEGATIVE Final    Comment: (NOTE) The Xpert Xpress SARS-CoV-2/FLU/RSV plus assay is intended as an aid in the diagnosis of influenza from Nasopharyngeal swab specimens and should not be used as a sole basis for treatment. Nasal washings and aspirates are unacceptable for Xpert Xpress SARS-CoV-2/FLU/RSV testing.  Fact Sheet for Patients: BloggerCourse.com  Fact Sheet for Healthcare Providers: SeriousBroker.it  This test is not yet approved or cleared by the [place] FDA and has been authorized for detection and/or  diagnosis of SARS-CoV-2 by FDA under an Emergency Use Authorization (EUA). This EUA will remain in effect (meaning this test can be used) for the duration of the COVID-19 declaration under Section 564(b)(1) of the Act, 21 U.S.C. section 360bbb-3(b)(1), unless the authorization is terminated or revoked.  Performed at [facility], [address], [place], [place] [address]          Radiology Studies: CT Head Wo Contrast  Result Date: 08/30/2020 CLINICAL DATA:  Possible syncopal episode today. EXAM: CT HEAD WITHOUT CONTRAST TECHNIQUE: Contiguous axial images were obtained from the base of the skull through the vertex without intravenous contrast. COMPARISON:  None. FINDINGS: Brain: Mildly enlarged ventricles and cortical sulci. Mild patchy white matter low density in both  cerebral hemispheres. No intracranial hemorrhage, mass lesion or CT evidence of acute infarction. Vascular: No hyperdense vessel or unexpected calcification. Skull: Normal. Negative for fracture or focal lesion. Sinuses/Orbits: Unremarkable. Other: Mild bilateral concha bullosa. IMPRESSION: 1. No acute abnormality. 2. Mild diffuse cerebral atrophy and mild chronic small vessel white matter ischemic changes in both cerebral hemispheres. Electronically Signed   By: Beckie Salts M.D.   On: 08/30/2020 20:16   MR BRAIN WO CONTRAST  Result Date: 08/31/2020 CLINICAL DATA:  Simple syncope with normal neuro exam EXAM: MRI HEAD WITHOUT CONTRAST TECHNIQUE: Multiplanar, multiecho pulse sequences of the brain and surrounding structures were obtained without intravenous contrast. COMPARISON:  Head CT from yesterday FINDINGS: Brain: No acute infarction, hemorrhage, hydrocephalus, extra-axial collection or mass lesion. Small FLAIR hyperintensities seemingly randomly scattered in the cerebral white matter and to a minimal extent in the pons, compatible with chronic small vessel ischemia. Age normal brain volume. Vascular:  Normal flow voids Skull and upper cervical spine: Normal marrow signal Sinuses/Orbits: Retention cyst in the right maxillary sinus. Negative orbits IMPRESSION: 1. No acute or reversible finding. 2. 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 Electronically Signed   By: Marnee Spring M.D.   On: 08/31/2020 11:15   VAS [place] ABI WITH/WO TBI  Result Date: 08/31/2020  LOWER EXTREMITY DOPPLER STUDY Patient Name:  JESTON JUNKINS  Date of Exam:   08/31/2020 Medical Rec #: [id number]          Accession #:    [id number] Date of Birth: [birth year]          Patient Gender: M Patient Age:   69Y Exam Location:  [facility] Procedure:      VAS [place] ABI WITH/WO TBI Referring Phys: [id number] VASUNDHRA RATHORE --------------------------------------------------------------------------------  Indications: Claudication.  Comparison Study: no prior Performing Technologist: [place] Ponder RVS  Examination Guidelines: A complete evaluation includes at minimum, Doppler waveform signals and systolic blood pressure reading at the level of bilateral brachial, anterior tibial, and posterior tibial arteries, when vessel segments are accessible. Bilateral testing is considered an integral part of a complete examination. Photoelectric Plethysmograph (PPG) waveforms and toe systolic pressure readings are included as required and additional duplex testing as needed. Limited examinations for reoccurring indications may be performed as noted.  ABI Findings: +--------+------------------+-----+---------+--------+ Right   Rt Pressure (mmHg)IndexWaveform Comment  +--------+------------------+-----+---------+--------+ [id number]                    triphasic         +--------+------------------+-----+---------+--------+ PTA     131               1.07 triphasic         +--------+------------------+-----+---------+--------+ DP      125               1.02 triphasic          +--------+------------------+-----+---------+--------+ +--------+------------------+-----+---------+-------+ Left    Lt Pressure (mmHg)IndexWaveform Comment +--------+------------------+-----+---------+-------+ [id number]                    triphasic        +--------+------------------+-----+---------+-------+ PTA     134               1.09 triphasic        +--------+------------------+-----+---------+-------+ DP      121               0.98 triphasic        +--------+------------------+-----+---------+-------+ +-------+-----------+-----------+------------+------------+  ABI/TBIToday's ABIToday's TBIPrevious ABIPrevious TBI +-------+-----------+-----------+------------+------------+ Right  1.07                                           +-------+-----------+-----------+------------+------------+ Left   1.09                                           +-------+-----------+-----------+------------+------------+  Summary: Right: Resting right ankle-brachial index is within normal range. No evidence of significant right lower extremity arterial disease. Left: Resting left ankle-brachial index is within normal range. No evidence of significant left lower extremity arterial disease.  *See table(s) above for measurements and observations.     Preliminary    ECHOCARDIOGRAM COMPLETE  Result Date: 08/31/2020    ECHOCARDIOGRAM REPORT   Patient Name:   Yetta BarreROBERT E Steil Date of Exam: 08/31/2020 Medical Rec #:  [id number]         Height:       71.0 in Accession #:    [id number]        Weight:       204.8 lb Date of Birth:  May 21, 1950         BSA:          2.130 m Patient Age:    69 years          BP:           119/61 mmHg Patient Gender: M                 HR:           68 bpm. Exam Location:  Inpatient Procedure: 2D Echo, Cardiac Doppler and Color Doppler Indications:    Syncope  History:        Patient has no prior history of Echocardiogram examinations.  Sonographer:    Neomia DearAMARA CROWN RDCS  Referring Phys: [id number] VASUNDHRA RATHORE IMPRESSIONS  1. Left ventricular ejection fraction, by estimation, is 60 to 65%. The left ventricle has normal function. The left ventricle has no regional wall motion abnormalities. Left ventricular diastolic parameters were normal.  2. Right ventricular systolic function is normal. The right ventricular size is normal. There is normal pulmonary artery systolic pressure.  3. The mitral valve is normal in structure. Trivial mitral valve regurgitation.  4. The aortic valve is normal in structure. Aortic valve regurgitation is not visualized. No aortic stenosis is present. FINDINGS  Left Ventricle: Left ventricular ejection fraction, by estimation, is 60 to 65%. The left ventricle has normal function. The left ventricle has no regional wall motion abnormalities. The left ventricular internal cavity size was normal in size. There is  no left ventricular hypertrophy. Left ventricular diastolic parameters were normal. Right Ventricle: The right ventricular size is normal. Right vetricular wall thickness was not well visualized. Right ventricular systolic function is normal. There is normal pulmonary artery systolic pressure. The tricuspid regurgitant velocity is 2.17 m/s, and with an assumed right atrial pressure of 3 mmHg, the estimated right ventricular systolic pressure is 21.8 mmHg. Left Atrium: Left atrial size was normal in size. Right Atrium: Right atrial size was normal in size. Pericardium: There is no evidence of pericardial effusion. Mitral Valve: The mitral valve is normal in structure. Trivial mitral valve regurgitation. Tricuspid Valve: The tricuspid valve is normal in structure. Tricuspid valve  regurgitation is mild. Aortic Valve: The aortic valve is normal in structure. Aortic valve regurgitation is not visualized. No aortic stenosis is present. Aortic valve mean gradient measures 5.0 mmHg. Aortic valve peak gradient measures 10.1 mmHg. Aortic valve area, by VTI  measures 3.19 cm. Pulmonic Valve: The pulmonic valve was grossly normal. Pulmonic valve regurgitation is trivial. Aorta: The aortic root and ascending aorta are structurally normal, with no evidence of dilitation. IAS/Shunts: The atrial septum is grossly normal.  LEFT VENTRICLE PLAX 2D LVIDd:         5.20 cm     Diastology LVIDs:         2.70 cm     LV e' medial:    6.42 cm/s LV PW:         0.70 cm     LV E/e' medial:  13.5 LV IVS:        0.90 cm     LV e' lateral:   11.60 cm/s LVOT diam:     2.20 cm     LV E/e' lateral: 7.4 LV SV:         84 LV SV Index:   39 LVOT Area:     3.80 cm  LV Volumes (MOD) LV vol d, MOD A2C: 62.9 ml LV vol d, MOD A4C: 88.6 ml LV vol s, MOD A2C: 27.2 ml LV vol s, MOD A4C: 41.5 ml LV SV MOD A2C:     35.7 ml LV SV MOD A4C:     88.6 ml LV SV MOD BP:      39.6 ml RIGHT VENTRICLE RV Basal diam:  4.30 cm RV Mid diam:    3.20 cm TAPSE (M-mode): 2.0 cm LEFT ATRIUM             Index       RIGHT ATRIUM           Index LA diam:        3.60 cm 1.69 cm/m  RA Area:     18.20 cm LA Vol (A2C):   60.2 ml 28.26 ml/m RA Volume:   47.10 ml  22.11 ml/m LA Vol (A4C):   48.0 ml 22.54 ml/m LA Biplane Vol: 53.4 ml 25.07 ml/m  AORTIC VALVE                    PULMONIC VALVE AV Area (Vmax):    2.80 cm     PV Vmax:       1.04 m/s AV Area (Vmean):   2.67 cm     PV Vmean:      68.850 cm/s AV Area (VTI):     3.19 cm     PV VTI:        0.201 m AV Vmax:           159.00 cm/s  PV Peak grad:  4.4 mmHg AV Vmean:          102.000 cm/s PV Mean grad:  2.5 mmHg AV VTI:            0.263 m AV Peak Grad:      10.1 mmHg AV Mean Grad:      5.0 mmHg LVOT Vmax:         117.00 cm/s LVOT Vmean:        71.700 cm/s LVOT VTI:          0.221 m LVOT/AV VTI ratio: 0.84  AORTA Ao Root diam: 3.80 cm Ao Asc diam:  3.60 cm  MITRAL VALVE               TRICUSPID VALVE MV Area (PHT): 3.54 cm    TR Peak grad:   18.8 mmHg MV Decel Time: 214 msec    TR Vmax:        217.00 cm/s MV E velocity: 86.40 cm/s MV A velocity: 90.80 cm/s  SHUNTS MV E/A  ratio:  0.95        Systemic VTI:  0.22 m                            Systemic Diam: 2.20 cm Kristeen Miss MD Electronically signed by Kristeen Miss MD Signature Date/Time: 08/31/2020/12:45:50 PM    Final    US THYROID  Result Date: 08/31/2020 CLINICAL DATA:  Right inferior thyroid nodule by chest CT EXAM: THYROID ULTRASOUND TECHNIQUE: Ultrasound examination of the thyroid gland and adjacent soft tissues was performed. COMPARISON:  08/30/2020 FINDINGS: Parenchymal Echotexture: Moderately heterogenous Isthmus: 1.6 cm Right lobe: 5.9 x 3.1 x 1.9 cm Left lobe: 4.8 x 2.0 x 1.6 cm _________________________________________________________ Estimated total number of nodules >/= 1 cm: 2 Number of spongiform nodules >/=  2 cm not described below (TR1): 0 Number of mixed cystic and solid nodules >/= 1.5 cm not described below (TR2): 0 _________________________________________________________ Nodule # 1: Location: Isthmus; Mid Maximum size: 3.0 cm; Other 2 dimensions: 1.5 x 2.4 cm Composition: solid/almost completely solid (2) Echogenicity: isoechoic (1) Shape: not taller-than-wide (0) Margins: ill-defined (0) Echogenic foci: macrocalcifications (1) ACR TI-RADS total points: 4. ACR TI-RADS risk category: TR4 (4-6 points). ACR TI-RADS recommendations: **Given size (>/= 1.5 cm) and appearance, fine needle aspiration of this moderately suspicious nodule should be considered based on TI-RADS criteria. _________________________________________________________ Nodule # 2: Location: Right; Inferior Maximum size: 3.4 cm; Other 2 dimensions: 3.3 x 2.9 cm Composition: solid/almost completely solid (2) Echogenicity: isoechoic (1) Shape: not taller-than-wide (0) Margins: ill-defined (0) Echogenic foci: none (0) ACR TI-RADS total points: 3. ACR TI-RADS risk category: TR3 (3 points). ACR TI-RADS recommendations: **Given size (>/= 2.5 cm) and appearance, fine needle aspiration of this mildly suspicious nodule should be considered based on  TI-RADS criteria. _________________________________________________________ IMPRESSION: 3 cm mid isthmus TR 4 nodule meets criteria for biopsy. 3.4 cm right inferior TR 3 nodule also meets criteria for biopsy. This nodule correlates with the chest CT finding. The above is in keeping with the ACR TI-RADS recommendations - J Am Coll Radiol 2017;14:587-595. Electronically Signed   By: Judie Petit.  Shick M.D.   On: 08/31/2020 08:02   VAS US CAROTID  Result Date: 08/31/2020 Carotid Arterial Duplex Study Patient Name:  SEBASTYAN SNODGRASS  Date of Exam:   08/31/2020 Medical Rec #: [id number]          Accession #:    [id number] Date of Birth: January 12, 1951          Patient Gender: M Patient Age:   69Y Exam Location:  [facility] Procedure:      VAS US CAROTID Referring Phys: [id number] VASUNDHRA RATHORE --------------------------------------------------------------------------------  Indications:       Syncope. Comparison Study:  no prior Performing Technologist: [place] Ponder RVS  Examination Guidelines: A complete evaluation includes B-mode imaging, spectral Doppler, color Doppler, and power Doppler as needed of all accessible portions of each vessel. Bilateral testing is considered an integral part of a complete examination. Limited examinations for reoccurring indications may be performed as noted.  Right Carotid Findings: +----------+--------+--------+--------+------------------+--------+  PSV cm/sEDV cm/sStenosisPlaque DescriptionComments +----------+--------+--------+--------+------------------+--------+ CCA Prox  101     27              heterogenous               +----------+--------+--------+--------+------------------+--------+ CCA Distal58      24              heterogenous               +----------+--------+--------+--------+------------------+--------+ ICA Prox  83      35      1-39%   heterogenous               +----------+--------+--------+--------+------------------+--------+  ICA Distal86      40                                         +----------+--------+--------+--------+------------------+--------+ ECA       82      18                                         +----------+--------+--------+--------+------------------+--------+ +----------+--------+-------+--------+-------------------+           PSV cm/sEDV cmsDescribeArm Pressure (mmHG) +----------+--------+-------+--------+-------------------+ [id number]                                         +----------+--------+-------+--------+-------------------+ +---------+--------+--+--------+--+---------+ VertebralPSV cm/s34EDV cm/s11Antegrade +---------+--------+--+--------+--+---------+  Left Carotid Findings: +----------+--------+--------+--------+------------------+--------+           PSV cm/sEDV cm/sStenosisPlaque DescriptionComments +----------+--------+--------+--------+------------------+--------+ CCA Prox  86      25              heterogenous               +----------+--------+--------+--------+------------------+--------+ CCA Distal76      27              heterogenous               +----------+--------+--------+--------+------------------+--------+ ICA Prox  65      27      1-39%   heterogenous               +----------+--------+--------+--------+------------------+--------+ ICA Distal83      39                                         +----------+--------+--------+--------+------------------+--------+ ECA       62      9                                          +----------+--------+--------+--------+------------------+--------+ +----------+--------+--------+--------+-------------------+           PSV cm/sEDV cm/sDescribeArm Pressure (mmHG) +----------+--------+--------+--------+-------------------+ Subclavian116                                         +----------+--------+--------+--------+-------------------+  +---------+--------+--+--------+--+---------+ VertebralPSV cm/s58EDV cm/s21Antegrade +---------+--------+--+--------+--+---------+   Summary: Right Carotid: Velocities in the right ICA are consistent with a 1-39% stenosis. Left  Carotid: Velocities in the left ICA are consistent with a 1-39% stenosis. Vertebrals: Bilateral vertebral arteries demonstrate antegrade flow. *See table(s) above for measurements and observations.     Preliminary    CT CHEST ABDOMEN PELVIS WO CONTRAST  Result Date: 08/30/2020 CLINICAL DATA:  Bilateral leg pain. Recent low back pain. Renal insufficiency. EXAM: CT CHEST, ABDOMEN AND PELVIS WITHOUT CONTRAST TECHNIQUE: Multidetector CT imaging of the chest, abdomen and pelvis was performed following the standard protocol without IV contrast. COMPARISON:  None. FINDINGS: CT CHEST FINDINGS Cardiovascular: Atheromatous calcifications, including the coronary arteries and aorta. Normal sized heart. No displaced intimal calcifications to indicate aortic dissection. No aneurysm. Mediastinum/Nodes: Mild to moderate diffuse enlargement of the thyroid gland with diffuse heterogeneity and nodularity. The largest discrete nodule is on the right measuring 1.7 cm in maximum diameter on image number 7/3. There is also a more confluent area of nodularity more inferiorly on the right, difficult to determine if there is a single nodule or confluence of smaller nodules measuring 3.4 cm in maximum diameter on image number 9/3. No enlarged lymph nodes.  Unremarkable esophagus. Lungs/Pleura: 1.0 cm calcified granuloma in the right lower lobe on image number 76/5. No noncalcified nodules. No airspace consolidation or pleural fluid. Musculoskeletal: Thoracic spine degenerative changes. CT ABDOMEN PELVIS FINDINGS Hepatobiliary: Small left lobe liver calcified granuloma. Normal appearing gallbladder. Pancreas: Unremarkable. No pancreatic ductal dilatation or surrounding inflammatory changes. Spleen: Normal in  size without focal abnormality. Adrenals/Urinary Tract: Normal appearing adrenal glands. Simple appearing upper pole right renal cyst. Normal appearing left kidney, ureters and urinary bladder with the exception of mild bladder wall thickening and enlarged prostate gland protruding into the base of the bladder. The urinary bladder is poorly distended. Stomach/Bowel: Unremarkable stomach, small bowel and colon. No evidence of appendicitis. Vascular/Lymphatic: Atheromatous arterial calcifications without aneurysm. No abdominal aortic aneurysm or displaced intimal calcifications to indicate dissection. No enlarged lymph nodes. Reproductive: Moderate to marked enlargement of the prostate gland with protrusion of the median lobe into the base of the urinary bladder. Other: Small bilateral inguinal hernias containing fat. Small umbilical hernia containing fat. Musculoskeletal: L5-S1 degenerative changes with Schmorl's node formation. Bilateral proximal femoral bone islands. IMPRESSION: 1. No acute abnormality. 2. Multinodular goiter with a possible 3.4 cm dominant nodule in the right lobe. Recommend thyroid US (ref: J Am Coll Radiol. 2015 Feb;12(2): 143-50). 3. 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 4. 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 5.  Calcific coronary artery and aortic atherosclerosis. Electronically Signed   By: Beckie Salts M.D.   On: 08/30/2020 20:15        Scheduled Meds:  insulin aspart  0-5 Units Subcutaneous QHS   insulin aspart  0-9 Units Subcutaneous TID WC   Continuous Infusions:  sodium chloride 125 mL/hr at 08/31/20 0915     LOS: 0 days    Time spent: 40 minutes.    Ramiro Harvest, MD [facility]   To contact the attending provider between 7A-7P or the covering provider during after hours 7P-7A, please log into the web site  [email or web address] and access using universal  password for that web site. If you do not have the password, please call the hospital operator.  08/31/2020, 12:46 PM

## 2020-08-31 NOTE — Progress Notes (Signed)
Date and time results received: 08/30/20  at 2353  Test: Lactic Acid Critical Value: 2.6  Name of Provider Notified: X.Blount  Orders Received? Or Actions Taken?: No new orders received

## 2020-08-31 NOTE — Progress Notes (Signed)
*  PRELIMINARY RESULTS* Echocardiogram 2D Echocardiogram has been performed.  Neomia Dear RDCS 08/31/2020, 12:21 PM

## 2020-09-01 LAB — CBC
HCT: 36.5 % — ABNORMAL LOW (ref 39.0–52.0)
Hemoglobin: 11.5 g/dL — ABNORMAL LOW (ref 13.0–17.0)
MCH: 26.6 pg (ref 26.0–34.0)
MCHC: 31.5 g/dL (ref 30.0–36.0)
MCV: 84.5 fL (ref 80.0–100.0)
Platelets: 180 10*3/uL (ref 150–400)
RBC: 4.32 MIL/uL (ref 4.22–5.81)
RDW: 14.1 % (ref 11.5–15.5)
WBC: 3.8 10*3/uL — ABNORMAL LOW (ref 4.0–10.5)
nRBC: 0 % (ref 0.0–0.2)

## 2020-09-01 LAB — LACTIC ACID, PLASMA: Lactic Acid, Venous: 1 mmol/L (ref 0.5–1.9)

## 2020-09-01 LAB — BASIC METABOLIC PANEL
Anion gap: 5 (ref 5–15)
BUN: 20 mg/dL (ref 8–23)
CO2: 27 mmol/L (ref 22–32)
Calcium: 9 mg/dL (ref 8.9–10.3)
Chloride: 107 mmol/L (ref 98–111)
Creatinine, Ser: 1.33 mg/dL — ABNORMAL HIGH (ref 0.61–1.24)
GFR, Estimated: 58 mL/min — ABNORMAL LOW (ref >60)
Glucose, Bld: 110 mg/dL — ABNORMAL HIGH (ref 70–99)
Potassium: 4.7 mmol/L (ref 3.5–5.1)
Sodium: 139 mmol/L (ref 135–145)

## 2020-09-01 LAB — GLUCOSE, CAPILLARY
Glucose-Capillary: 118 mg/dL — ABNORMAL HIGH (ref 70–99)
Glucose-Capillary: 99 mg/dL (ref 70–99)

## 2020-09-01 MED ORDER — ACETAMINOPHEN 325 MG PO TABS: 650.0000 mg | ORAL_TABLET | Freq: Four times a day (QID) | ORAL | Status: AC | PRN

## 2020-09-01 NOTE — Discharge Summary (Signed)
Physician Discharge Summary  Calvin Harris ZOX:[id number] DOB: [birth year] DOA: 08/30/2020  PCP: Jeannetta Nap, MD  Admit date: 08/30/2020 Discharge date: 09/01/2020  Time spent: 55 minutes  Recommendations for Outpatient Follow-up:  Follow-up with Jeannetta Nap, MD in 1 week.  On follow-up patient will need basic metabolic profile done to follow-up on electrolytes and renal function.  Patient blood pressure need to be reassessed as patient lisinopril HCTZ discontinued on discharge.  Patient statin was discontinued due to concern as the etiology of patient's myalgias of his upper thighs and lower abdomen. Patient's multinodular goiter will need to be followed up upon.  Patient may benefit from being set up with an event monitor to rule out any arrhythmias as the etiology of his syncopal episode.   Discharge Diagnoses:  Principal Problem:   Syncope Active Problems:   Acute-on-chronic kidney injury (HCC)   Hypertension   Hyperlipidemia   Diabetes (HCC)   Nodule of right lobe of thyroid gland   Syncope and collapse   Discharge Condition: Stable and improved  Diet recommendation: Carb modified  Filed Weights   08/30/20 2125 08/30/20 2249  Weight: 92.5 kg 92.9 kg    History of present illness:  HPI per Dr. Mauri Brooklyn is a 70 y.o. male with medical history significant of hypertension, hyperlipidemia, history of DVT on Eliquis, type II diabetes, CKD stage III AAA, lumbar radiculopathy, BPH, erectile dysfunction presented to the ED after a witnessed syncopal event with no prodrome.  Also complained of cramping in his abdomen and legs.  In the ED, vital signs stable.  Labs showing WBC 5.5, hemoglobin 13.3, platelet count 226K.  Sodium 135, potassium 4.8, chloride 98, bicarb 27, BUN 39, creatinine 2.6, glucose 115.  Lipase and LFTs normal.  Magnesium 1.9.  EKG showing sinus rhythm.  High-sensitivity troponin negative x2.  CK 912.  UA not suggestive of infection.   Lactic acid 1.2.  COVID and influenza PCR negative.  Head CT negative for acute finding.  CT chest/abdomen/pelvis negative for acute finding. Patient was given 1 L normal saline bolus.   Patient states he has had on and off episodes of dizziness and blurred vision for several months.  He thinks it is due to him not drinking enough fluids.  Today he felt dizzy all day and while at a family gathering he felt like he was going to blackout and had to sit down.  He does not remember what happened but was told by family that after sitting down on a chair he was just staring and then became unresponsive.  He denies any preceding shortness of breath or chest pain.  Denies history of seizures or stroke.  He is also complaining of ongoing cramps in his lower abdomen and upper thighs for several months.  States he has problems with his lower back including sciatica and spinal stenosis for which he gets injections.  Sometimes his legs cramp when he changes position but sometimes even at rest.  The cramping is mostly in his upper thighs but sometimes he feels it in his calves as well.  He has noticed that during sexual activity specially after he orgasms he feels cramping pain in his upper inner thighs.  Denies history of peripheral arterial disease.  Does report history of prior DVTs in his right leg for which he is on Eliquis and compliant.  No swelling of his legs.  Hospital Course:  1 syncope -Likely secondary to hypovolemia leading to hypoperfusion from dehydration.  Patient noted  to have soft blood pressure on admission with systolics in the mid 90s.  Patient also noted to be in acute kidney injury with elevated BUN/creatinine. -Orthostatics were checked however patient had been started on IV fluids and orthostatics were negative. -Patient denied any bowel or urinary incontinence seizure likely low on the differential. -No arrhythmias noted on telemetry. -CT head and MRI head negative for any acute  abnormalities. -Carotid Dopplers with no significant ICA stenosis. -2D echo with EF of 60 to 65%,NWMA, no aortic valvular abnormalities noted.. -Patient with no signs or symptoms of infection. -Patient aggressively hydrated with IV fluids.   -Patient improved clinically.  -Case discussed with neurology on call, and is felt seizure likely low on the differential and as such discontinued EEG. -Patient improved clinically did not have any further syncopal episodes and will be discharged home in stable and improved condition.   -May benefit from event monitor to be set up in the outpatient setting.    2.  Acute renal failure on chronic kidney disease stage IIIa -Likely secondary to a prerenal azotemia in the setting of lisinopril HCTZ. -Last creatinine from care everywhere was 1.26 on 12/19/2019 and 1.44 on 11/14/2019. -Creatinine on admission was 2.68. -Urinalysis nitrite negative, leukocytes negative, 30 protein. -CT abdomen and pelvis negative for hydronephrosis. -Patient's lisinopril HCTZ were discontinued during the hospitalization and patient hydrated aggressively with IV fluids.  -Renal function improved such that by day of discharge creatinine was down to 1.33 from 2.68 on admission.   -Lisinopril HCTZ will not be resumed on discharge.   -Outpatient follow-up with PCP.    3.  Cramping in lower abdomen/thighs -Patient with a history of lumbar radiculopathy. -CT abdomen and pelvis negative for any intra-abdominal finding. -Bilateral ABIs within normal range. -Statin was held and likely etiology for patient's cramping in lower abdomen and thighs.   -Statin discontinued on discharge until follow-up with PCP.  4.  Hypertension -Patient's antihypertensive medications were held during the hospitalization and will be discontinued on discharge as patient had presented with acute kidney injury and syncopal episode felt likely related to hypoperfusion.   -Blood pressure remained well controlled  during the hospitalization.  -Outpatient follow-up with PCP.   5.  BPH -Patient maintained on home regimen Flomax.  6.  Hyperlipidemia -Statin was held secondary to elevated CK and cramping in lower abdomen and thighs.   -Statin will be discontinued on discharge until follow-up with PCP.    7.  History of recurrent DVTs -Patient maintained on home regimen Eliquis.   8.  Diabetes mellitus type 2 -Hemoglobin A1c 7.6. -Patient's oral hypoglycemic agents were held during the hospitalization.   -Patient maintained on SSI.    9.  Multinodular goiter -Incidental finding noted on CT chest. -Thyroid ultrasound with 3 cm mid isthmus nodule, 3.4 cm right inferior nodule meeting criteria for biopsy. -Discussed with patient who stated he has known multinodular goiter and has biopsies done before in the past. -Outpatient follow-up with PCP.    Procedures: CT head 08/30/2020 MRI head 08/31/2020 2D echo 08/31/2020 Carotid Dopplers 08/31/2020 ABI with TBI 08/31/2020 CT chest abdomen and pelvis 08/30/2020 Thyroid ultrasound 08/31/2020  Consultations: Curb sided neurology 08/31/2020  Discharge Exam: Vitals:   08/31/20 2154 09/01/20 0542  BP: 138/74 119/76  Pulse: 74 65  Resp: 15 16  Temp: 98.3 F (36.8 C) 97.8 F (36.6 C)  SpO2: 98% 99%    General: NAD Cardiovascular: RRR no murmurs rubs or gallops.  No JVD.  No lower  extremity edema. Respiratory: CTA B.  No wheezes, no crackles, no rhonchi.  Normal respiratory effort.  Discharge Instructions   Discharge Instructions     Diet Carb Modified   Complete by: As directed    Increase activity slowly   Complete by: As directed       Allergies as of 09/01/2020   No Known Allergies      Medication List     STOP taking these medications    atorvastatin 20 MG tablet Commonly known as: LIPITOR   lisinopril-hydrochlorothiazide 20-12.5 MG tablet Commonly known as: ZESTORETIC       TAKE these medications    acetaminophen 325  MG tablet Commonly known as: TYLENOL Take 2 tablets (650 mg total) by mouth every 6 (six) hours as needed for mild pain (or Fever >/= 101).   aspirin 81 MG chewable tablet Chew 81 mg by mouth daily.   Eliquis 2.5 MG Tabs tablet Generic drug: apixaban Take 2.5 mg by mouth 2 (two) times daily.   metFORMIN 500 MG tablet Commonly known as: GLUCOPHAGE Take 500 mg by mouth 2 (two) times daily.   tadalafil 5 MG tablet Commonly known as: CIALIS Take 5 mg by mouth daily.   tamsulosin 0.4 MG Caps capsule Commonly known as: FLOMAX Take 0.8 mg by mouth at bedtime.   therapeutic multivitamin-minerals tablet Take 1 tablet by mouth daily.       No Known Allergies  Follow-up Information     Jeannetta Nap, MD. Schedule an appointment as soon as possible for a visit in 1 week(s).   Specialty: Family Medicine Contact information: [address] [place] [address] [phone]                  The results of significant diagnostics from this hospitalization (including imaging, microbiology, ancillary and laboratory) are listed below for reference.    Significant Diagnostic Studies: CT Head Wo Contrast  Result Date: 08/30/2020 CLINICAL DATA:  Possible syncopal episode today. EXAM: CT HEAD WITHOUT CONTRAST TECHNIQUE: Contiguous axial images were obtained from the base of the skull through the vertex without intravenous contrast. COMPARISON:  None. FINDINGS: Brain: Mildly enlarged ventricles and cortical sulci. Mild patchy white matter low density in both cerebral hemispheres. No intracranial hemorrhage, mass lesion or CT evidence of acute infarction. Vascular: No hyperdense vessel or unexpected calcification. Skull: Normal. Negative for fracture or focal lesion. Sinuses/Orbits: Unremarkable. Other: Mild bilateral concha bullosa. IMPRESSION: 1. No acute abnormality. 2. Mild diffuse cerebral atrophy and mild chronic small vessel white matter ischemic changes in both  cerebral hemispheres. Electronically Signed   By: Beckie Salts M.D.   On: 08/30/2020 20:16   MR BRAIN WO CONTRAST  Result Date: 08/31/2020 CLINICAL DATA:  Simple syncope with normal neuro exam EXAM: MRI HEAD WITHOUT CONTRAST TECHNIQUE: Multiplanar, multiecho pulse sequences of the brain and surrounding structures were obtained without intravenous contrast. COMPARISON:  Head CT from yesterday FINDINGS: Brain: No acute infarction, hemorrhage, hydrocephalus, extra-axial collection or mass lesion. Small FLAIR hyperintensities seemingly randomly scattered in the cerebral white matter and to a minimal extent in the pons, compatible with chronic small vessel ischemia. Age normal brain volume. Vascular: Normal flow voids Skull and upper cervical spine: Normal marrow signal Sinuses/Orbits: Retention cyst in the right maxillary sinus. Negative orbits IMPRESSION: 1. No acute or reversible finding. 2. 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 Electronically Signed   By: Marnee Spring M.D.   On: 08/31/2020 11:15   VAS [place]  ABI WITH/WO TBI  Result Date: 08/31/2020  LOWER EXTREMITY DOPPLER STUDY Patient Name:  Calvin Harris  Date of Exam:   08/31/2020 Medical Rec #: [id number]          Accession #:    [id number] Date of Birth: Dec 03, 1950          Patient Gender: M Patient Age:   69Y [facility] Procedure:      VAS US ABI WITH/WO TBI Referring Phys: [id number] VASUNDHRA RATHORE --------------------------------------------------------------------------------  Indications: Claudication.  Comparison Study: no prior Performing Technologist: [place] PonderMegan Stricklin RVS  Examination Guidelines: A complete evaluation includes at minimum, Doppler waveform signals and systolic blood pressure reading at the level of bilateral brachial, anterior tibial, and posterior tibial arteries, when vessel segments are accessible. Bilateral testing is considered an integral part of a complete examination.  Photoelectric Plethysmograph (PPG) waveforms and toe systolic pressure readings are included as required and additional duplex testing as needed. Limited examinations for reoccurring indications may be performed as noted.  ABI Findings: +--------+------------------+-----+---------+--------+ Right   Rt Pressure (mmHg)IndexWaveform Comment  +--------+------------------+-----+---------+--------+ [id number]                    triphasic         +--------+------------------+-----+---------+--------+ PTA     131               1.07 triphasic         +--------+------------------+-----+---------+--------+ DP      125               1.02 triphasic         +--------+------------------+-----+---------+--------+ +--------+------------------+-----+---------+-------+ Left    Lt Pressure (mmHg)IndexWaveform Comment +--------+------------------+-----+---------+-------+ [id number]                    triphasic        +--------+------------------+-----+---------+-------+ PTA     134               1.09 triphasic        +--------+------------------+-----+---------+-------+ DP      121               0.98 triphasic        +--------+------------------+-----+---------+-------+ +-------+-----------+-----------+------------+------------+ ABI/TBIToday's ABIToday's TBIPrevious ABIPrevious TBI +-------+-----------+-----------+------------+------------+ Right  1.07                                           +-------+-----------+-----------+------------+------------+ Left   1.09                                           +-------+-----------+-----------+------------+------------+  Summary: Right: Resting right ankle-brachial index is within normal range. No evidence of significant right lower extremity arterial disease. Left: Resting left ankle-brachial index is within normal range. No evidence of significant left lower extremity arterial disease.  *See table(s) above for measurements  and observations.  Electronically signed by Heath Larkhomas Hawken on 08/31/2020 at 1:07:43 PM.    Final    ECHOCARDIOGRAM COMPLETE  Result Date: 08/31/2020    ECHOCARDIOGRAM REPORT   Patient Name:   Calvin BarreROBERT E Harris Date of Exam: 08/31/2020 Medical Rec #:  [id number]         Height:       71.0 in Accession #:    [id number]  Weight:       204.8 lb Date of Birth:  1950/05/11         BSA:          2.130 m Patient Age:    69 years          BP:           119/61 mmHg Patient Gender: M                 HR:           68 bpm. Exam Location:  Inpatient Procedure: 2D Echo, Cardiac Doppler and Color Doppler Indications:    Syncope  History:        Patient has no prior history of Echocardiogram examinations.  Sonographer:    Neomia Dear RDCS Referring Phys: [id number] VASUNDHRA RATHORE IMPRESSIONS  1. Left ventricular ejection fraction, by estimation, is 60 to 65%. The left ventricle has normal function. The left ventricle has no regional wall motion abnormalities. Left ventricular diastolic parameters were normal.  2. Right ventricular systolic function is normal. The right ventricular size is normal. There is normal pulmonary artery systolic pressure.  3. The mitral valve is normal in structure. Trivial mitral valve regurgitation.  4. The aortic valve is normal in structure. Aortic valve regurgitation is not visualized. No aortic stenosis is present. FINDINGS  Left Ventricle: Left ventricular ejection fraction, by estimation, is 60 to 65%. The left ventricle has normal function. The left ventricle has no regional wall motion abnormalities. The left ventricular internal cavity size was normal in size. There is  no left ventricular hypertrophy. Left ventricular diastolic parameters were normal. Right Ventricle: The right ventricular size is normal. Right vetricular wall thickness was not well visualized. Right ventricular systolic function is normal. There is normal pulmonary artery systolic pressure. The tricuspid regurgitant  velocity is 2.17 m/s, and with an assumed right atrial pressure of 3 mmHg, the estimated right ventricular systolic pressure is 21.8 mmHg. Left Atrium: Left atrial size was normal in size. Right Atrium: Right atrial size was normal in size. Pericardium: There is no evidence of pericardial effusion. Mitral Valve: The mitral valve is normal in structure. Trivial mitral valve regurgitation. Tricuspid Valve: The tricuspid valve is normal in structure. Tricuspid valve regurgitation is mild. Aortic Valve: The aortic valve is normal in structure. Aortic valve regurgitation is not visualized. No aortic stenosis is present. Aortic valve mean gradient measures 5.0 mmHg. Aortic valve peak gradient measures 10.1 mmHg. Aortic valve area, by VTI measures 3.19 cm. Pulmonic Valve: The pulmonic valve was grossly normal. Pulmonic valve regurgitation is trivial. Aorta: The aortic root and ascending aorta are structurally normal, with no evidence of dilitation. IAS/Shunts: The atrial septum is grossly normal.  LEFT VENTRICLE PLAX 2D LVIDd:         5.20 cm     Diastology LVIDs:         2.70 cm     LV e' medial:    6.42 cm/s LV PW:         0.70 cm     LV E/e' medial:  13.5 LV IVS:        0.90 cm     LV e' lateral:   11.60 cm/s LVOT diam:     2.20 cm     LV E/e' lateral: 7.4 LV SV:         84 LV SV Index:   39 LVOT Area:     3.80 cm  LV Volumes (MOD)  LV vol d, MOD A2C: 62.9 ml LV vol d, MOD A4C: 88.6 ml LV vol s, MOD A2C: 27.2 ml LV vol s, MOD A4C: 41.5 ml LV SV MOD A2C:     35.7 ml LV SV MOD A4C:     88.6 ml LV SV MOD BP:      39.6 ml RIGHT VENTRICLE RV Basal diam:  4.30 cm RV Mid diam:    3.20 cm TAPSE (M-mode): 2.0 cm LEFT ATRIUM             Index       RIGHT ATRIUM           Index LA diam:        3.60 cm 1.69 cm/m  RA Area:     18.20 cm LA Vol (A2C):   60.2 ml 28.26 ml/m RA Volume:   47.10 ml  22.11 ml/m LA Vol (A4C):   48.0 ml 22.54 ml/m LA Biplane Vol: 53.4 ml 25.07 ml/m  AORTIC VALVE                    PULMONIC VALVE AV  Area (Vmax):    2.80 cm     PV Vmax:       1.04 m/s AV Area (Vmean):   2.67 cm     PV Vmean:      68.850 cm/s AV Area (VTI):     3.19 cm     PV VTI:        0.201 m AV Vmax:           159.00 cm/s  PV Peak grad:  4.4 mmHg AV Vmean:          102.000 cm/s PV Mean grad:  2.5 mmHg AV VTI:            0.263 m AV Peak Grad:      10.1 mmHg AV Mean Grad:      5.0 mmHg LVOT Vmax:         117.00 cm/s LVOT Vmean:        71.700 cm/s LVOT VTI:          0.221 m LVOT/AV VTI ratio: 0.84  AORTA Ao Root diam: 3.80 cm Ao Asc diam:  3.60 cm MITRAL VALVE               TRICUSPID VALVE MV Area (PHT): 3.54 cm    TR Peak grad:   18.8 mmHg MV Decel Time: 214 msec    TR Vmax:        217.00 cm/s MV E velocity: 86.40 cm/s MV A velocity: 90.80 cm/s  SHUNTS MV E/A ratio:  0.95        Systemic VTI:  0.22 m                            Systemic Diam: 2.20 cm Kristeen Miss MD Electronically signed by Kristeen Miss MD Signature Date/Time: 08/31/2020/12:45:50 PM    Final    US THYROID  Result Date: 08/31/2020 CLINICAL DATA:  Right inferior thyroid nodule by chest CT EXAM: THYROID ULTRASOUND TECHNIQUE: Ultrasound examination of the thyroid gland and adjacent soft tissues was performed. COMPARISON:  08/30/2020 FINDINGS: Parenchymal Echotexture: Moderately heterogenous Isthmus: 1.6 cm Right lobe: 5.9 x 3.1 x 1.9 cm Left lobe: 4.8 x 2.0 x 1.6 cm _________________________________________________________ Estimated total number of nodules >/= 1 cm: 2 Number of spongiform nodules >/=  2 cm not described below (  TR1): 0 Number of mixed cystic and solid nodules >/= 1.5 cm not described below (TR2): 0 _________________________________________________________ Nodule # 1: Location: Isthmus; Mid Maximum size: 3.0 cm; Other 2 dimensions: 1.5 x 2.4 cm Composition: solid/almost completely solid (2) Echogenicity: isoechoic (1) Shape: not taller-than-wide (0) Margins: ill-defined (0) Echogenic foci: macrocalcifications (1) ACR TI-RADS total points: 4. ACR TI-RADS risk  category: TR4 (4-6 points). ACR TI-RADS recommendations: **Given size (>/= 1.5 cm) and appearance, fine needle aspiration of this moderately suspicious nodule should be considered based on TI-RADS criteria. _________________________________________________________ Nodule # 2: Location: Right; Inferior Maximum size: 3.4 cm; Other 2 dimensions: 3.3 x 2.9 cm Composition: solid/almost completely solid (2) Echogenicity: isoechoic (1) Shape: not taller-than-wide (0) Margins: ill-defined (0) Echogenic foci: none (0) ACR TI-RADS total points: 3. ACR TI-RADS risk category: TR3 (3 points). ACR TI-RADS recommendations: **Given size (>/= 2.5 cm) and appearance, fine needle aspiration of this mildly suspicious nodule should be considered based on TI-RADS criteria. _________________________________________________________ IMPRESSION: 3 cm mid isthmus TR 4 nodule meets criteria for biopsy. 3.4 cm right inferior TR 3 nodule also meets criteria for biopsy. This nodule correlates with the chest CT finding. The above is in keeping with the ACR TI-RADS recommendations - J Am Coll Radiol 2017;14:587-595. Electronically Signed   By: Judie Petit.  Shick M.D.   On: 08/31/2020 08:02   VAS US CAROTID  Result Date: 08/31/2020 Carotid Arterial Duplex Study Patient Name:  Calvin Harris  Date of Exam:   08/31/2020 Medical Rec #: [id number]          Accession #:    [id number] Date of Birth: 29-Jul-1950          Patient Gender: M Patient Age:   69Y Exam Location:  [facility] Procedure:      VAS US CAROTID Referring Phys: [id number] VASUNDHRA RATHORE --------------------------------------------------------------------------------  Indications:       Syncope. Comparison Study:  no prior Performing Technologist: [place] Ponder RVS  Examination Guidelines: A complete evaluation includes B-mode imaging, spectral Doppler, color Doppler, and power Doppler as needed of all accessible portions of each vessel. Bilateral testing is considered an integral  part of a complete examination. Limited examinations for reoccurring indications may be performed as noted.  Right Carotid Findings: +----------+--------+--------+--------+------------------+--------+           PSV cm/sEDV cm/sStenosisPlaque DescriptionComments +----------+--------+--------+--------+------------------+--------+ CCA Prox  101     27              heterogenous               +----------+--------+--------+--------+------------------+--------+ CCA Distal58      24              heterogenous               +----------+--------+--------+--------+------------------+--------+ ICA Prox  83      35      1-39%   heterogenous               +----------+--------+--------+--------+------------------+--------+ ICA Distal86      40                                         +----------+--------+--------+--------+------------------+--------+ ECA       82      18                                         +----------+--------+--------+--------+------------------+--------+ +----------+--------+-------+--------+-------------------+  PSV cm/sEDV cmsDescribeArm Pressure (mmHG) +----------+--------+-------+--------+-------------------+ [id number]                                         +----------+--------+-------+--------+-------------------+ +---------+--------+--+--------+--+---------+ VertebralPSV cm/s34EDV cm/s11Antegrade +---------+--------+--+--------+--+---------+  Left Carotid Findings: +----------+--------+--------+--------+------------------+--------+           PSV cm/sEDV cm/sStenosisPlaque DescriptionComments +----------+--------+--------+--------+------------------+--------+ CCA Prox  86      25              heterogenous               +----------+--------+--------+--------+------------------+--------+ CCA Distal76      27              heterogenous               +----------+--------+--------+--------+------------------+--------+  ICA Prox  65      27      1-39%   heterogenous               +----------+--------+--------+--------+------------------+--------+ ICA Distal83      39                                         +----------+--------+--------+--------+------------------+--------+ ECA       62      9                                          +----------+--------+--------+--------+------------------+--------+ +----------+--------+--------+--------+-------------------+           PSV cm/sEDV cm/sDescribeArm Pressure (mmHG) +----------+--------+--------+--------+-------------------+ Subclavian116                                         +----------+--------+--------+--------+-------------------+ +---------+--------+--+--------+--+---------+ VertebralPSV cm/s58EDV cm/s21Antegrade +---------+--------+--+--------+--+---------+   Summary: Right Carotid: Velocities in the right ICA are consistent with a 1-39% stenosis. Left Carotid: Velocities in the left ICA are consistent with a 1-39% stenosis. Vertebrals: Bilateral vertebral arteries demonstrate antegrade flow. *See table(s) above for measurements and observations.  Electronically signed by Heath Lark on 08/31/2020 at 1:07:35 PM.    Final    CT CHEST ABDOMEN PELVIS WO CONTRAST  Result Date: 08/30/2020 CLINICAL DATA:  Bilateral leg pain. Recent low back pain. Renal insufficiency. EXAM: CT CHEST, ABDOMEN AND PELVIS WITHOUT CONTRAST TECHNIQUE: Multidetector CT imaging of the chest, abdomen and pelvis was performed following the standard protocol without IV contrast. COMPARISON:  None. FINDINGS: CT CHEST FINDINGS Cardiovascular: Atheromatous calcifications, including the coronary arteries and aorta. Normal sized heart. No displaced intimal calcifications to indicate aortic dissection. No aneurysm. Mediastinum/Nodes: Mild to moderate diffuse enlargement of the thyroid gland with diffuse heterogeneity and nodularity. The largest discrete nodule is on the  right measuring 1.7 cm in maximum diameter on image number 7/3. There is also a more confluent area of nodularity more inferiorly on the right, difficult to determine if there is a single nodule or confluence of smaller nodules measuring 3.4 cm in maximum diameter on image number 9/3. No enlarged lymph nodes.  Unremarkable esophagus. Lungs/Pleura: 1.0 cm calcified granuloma in the right lower lobe on image number 76/5. No noncalcified nodules. No airspace consolidation or pleural fluid. Musculoskeletal: Thoracic spine degenerative changes. CT  ABDOMEN PELVIS FINDINGS Hepatobiliary: Small left lobe liver calcified granuloma. Normal appearing gallbladder. Pancreas: Unremarkable. No pancreatic ductal dilatation or surrounding inflammatory changes. Spleen: Normal in size without focal abnormality. Adrenals/Urinary Tract: Normal appearing adrenal glands. Simple appearing upper pole right renal cyst. Normal appearing left kidney, ureters and urinary bladder with the exception of mild bladder wall thickening and enlarged prostate gland protruding into the base of the bladder. The urinary bladder is poorly distended. Stomach/Bowel: Unremarkable stomach, small bowel and colon. No evidence of appendicitis. Vascular/Lymphatic: Atheromatous arterial calcifications without aneurysm. No abdominal aortic aneurysm or displaced intimal calcifications to indicate dissection. No enlarged lymph nodes. Reproductive: Moderate to marked enlargement of the prostate gland with protrusion of the median lobe into the base of the urinary bladder. Other: Small bilateral inguinal hernias containing fat. Small umbilical hernia containing fat. Musculoskeletal: L5-S1 degenerative changes with Schmorl's node formation. Bilateral proximal femoral bone islands. IMPRESSION: 1. No acute abnormality. 2. Multinodular goiter with a possible 3.4 cm dominant nodule in the right lobe. Recommend thyroid US (ref: J Am Coll Radiol. 2015 Feb;12(2): 143-50). 3.  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 4. 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 5.  Calcific coronary artery and aortic atherosclerosis. Electronically Signed   By: Beckie Salts M.D.   On: 08/30/2020 20:15    Microbiology: Recent Results (from the past 240 hour(s))  Resp Panel by RT-PCR (Flu A&B, Covid) Nasopharyngeal Swab     Status: None   Collection Time: 08/30/20  9:10 PM   Specimen: Nasopharyngeal Swab; Nasopharyngeal(NP) swabs in vial transport medium  Result Value Ref Range Status   SARS Coronavirus 2 by RT PCR NEGATIVE NEGATIVE Final    Comment: (NOTE) SARS-CoV-2 target nucleic acids are NOT DETECTED.  The SARS-CoV-2 RNA is generally detectable in upper respiratory specimens during the acute phase of infection. The lowest concentration of SARS-CoV-2 viral copies this assay can detect is 138 copies/mL. A negative result does not preclude SARS-Cov-2 infection and should not be used as the sole basis for treatment or other patient management decisions. A negative result may occur with  improper specimen collection/handling, submission of specimen other than nasopharyngeal swab, presence of viral mutation(s) within the areas targeted by this assay, and inadequate number of viral copies(<138 copies/mL). A negative result must be combined with clinical observations, patient history, and epidemiological information. The expected result is Negative.  Fact Sheet for Patients:  BloggerCourse.com  Fact Sheet for Healthcare Providers:  SeriousBroker.it  This test is no t yet approved or cleared by the [place] FDA and  has been authorized for detection and/or diagnosis of SARS-CoV-2 by FDA under an Emergency Use Authorization (EUA). This EUA will remain  in effect (meaning this test can be  used) for the duration of the COVID-19 declaration under Section 564(b)(1) of the Act, 21 U.S.C.section 360bbb-3(b)(1), unless the authorization is terminated  or revoked sooner.       Influenza A by PCR NEGATIVE NEGATIVE Final   Influenza B by PCR NEGATIVE NEGATIVE Final    Comment: (NOTE) The Xpert Xpress SARS-CoV-2/FLU/RSV plus assay is intended as an aid in the diagnosis of influenza from Nasopharyngeal swab specimens and should not be used as a sole basis for treatment. Nasal washings and aspirates are unacceptable for Xpert Xpress SARS-CoV-2/FLU/RSV testing.  Fact Sheet for Patients: BloggerCourse.com  Fact Sheet for Healthcare Providers: SeriousBroker.it  This  test is not yet approved or cleared by the [place] and has been authorized for detection and/or diagnosis of SARS-CoV-2 by FDA under an Emergency Use Authorization (EUA). This EUA will remain in effect (meaning this test can be used) for the duration of the COVID-19 declaration under Section 564(b)(1) of the Act, 21 U.S.C. section 360bbb-3(b)(1), unless the authorization is terminated or revoked.  Performed at [facility], [address] [place], [place] [address]      Labs: Basic Metabolic Panel: Recent Labs  Lab 08/30/20 1740 08/31/20 0543 09/01/20 0453  NA 135 139 139  K 4.8 4.7 4.7  CL 98 106 107  CO2 GLUCOSE 115* 105* 110*  BUN 39* 35* 20  CREATININE 2.68* 1.83* 1.33*  CALCIUM 10.3 9.4 9.0  MG 1.9  --   --    Liver Function Tests: Recent Labs  Lab 08/30/20 1740  AST 24  ALT 20  ALKPHOS 56  BILITOT 0.7  PROT 7.5  ALBUMIN 4.6   Recent Labs  Lab 08/30/20 1740  LIPASE 31   No results for input(s): AMMONIA in the last 168 hours. CBC: Recent Labs  Lab 08/30/20 1740 09/01/20 0453  WBC 5.5 3.8*  NEUTROABS 4.1  --   HGB 13.3 11.5*  HCT 40.5 36.5*  MCV 82.3 84.5  PLT 226 180   Cardiac  Enzymes: Recent Labs  Lab 08/30/20 1740  CKTOTAL 912*   BNP: BNP (last 3 results) No results for input(s): BNP in the last 8760 hours.  ProBNP (last 3 results) No results for input(s): PROBNP in the last 8760 hours.  CBG: Recent Labs  Lab 08/31/20 1310 08/31/20 1858 08/31/20 2154 09/01/20 0816 09/01/20 1205  GLUCAP 135* 123* 153* 118* 99       Signed:  Ramiro Harvest MD.  [facility] 09/01/2020, 12:36 PM

## 2022-10-28 IMAGING — US US THYROID
1 series · 13 of 25 positions shown · non-contrast
Comparison: 08/30/2020

CLINICAL DATA: Right inferior thyroid nodule by chest CT

EXAM:
THYROID ULTRASOUND
TECHNIQUE: Ultrasound examination of the thyroid gland and adjacent soft
tissues was performed.

[Series 1: us thyroid · 13 of 79 slices shown]
[im 1/79]
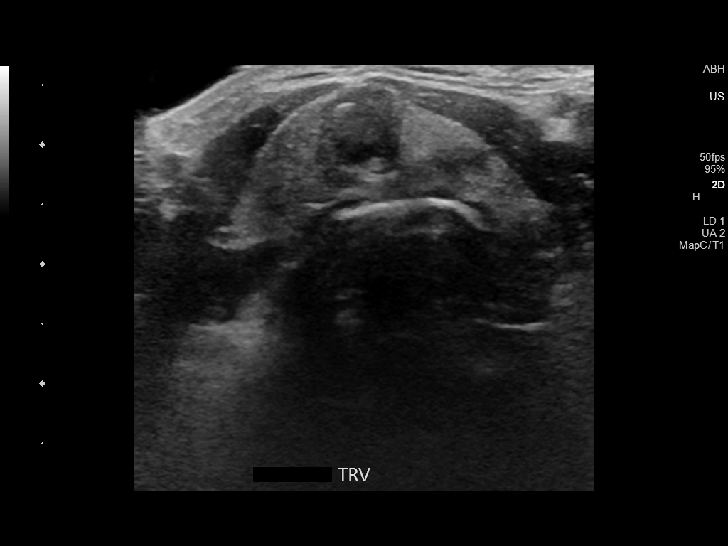
[im 7/79]
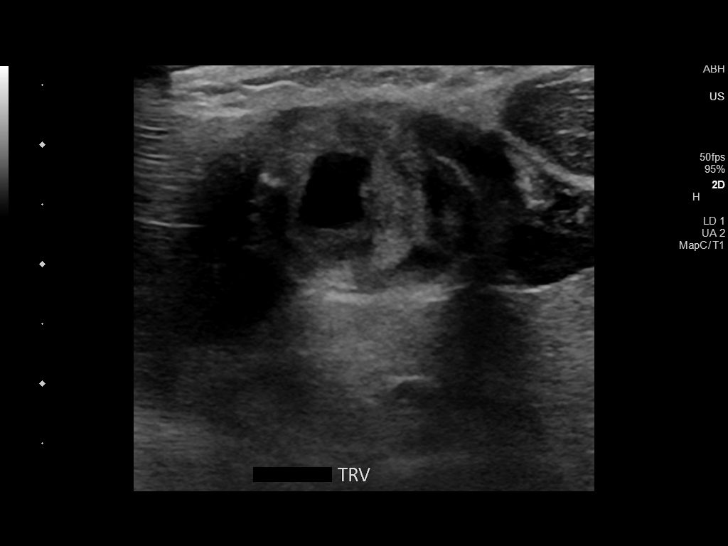
[im 14/79]
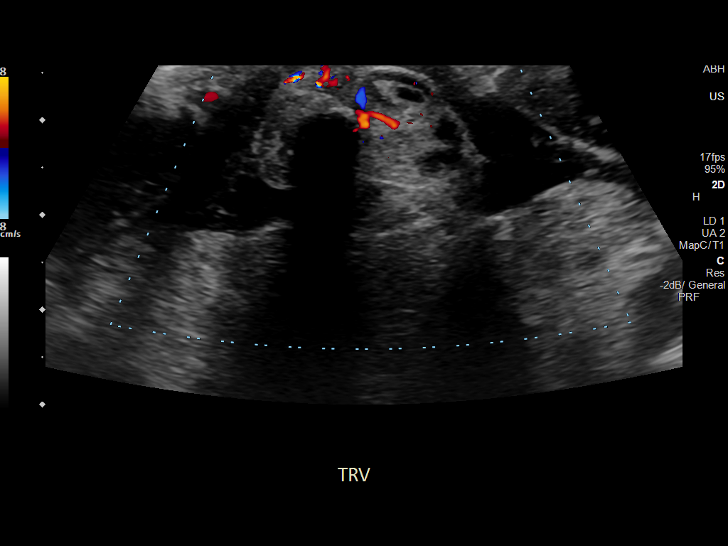
[im 20/79]
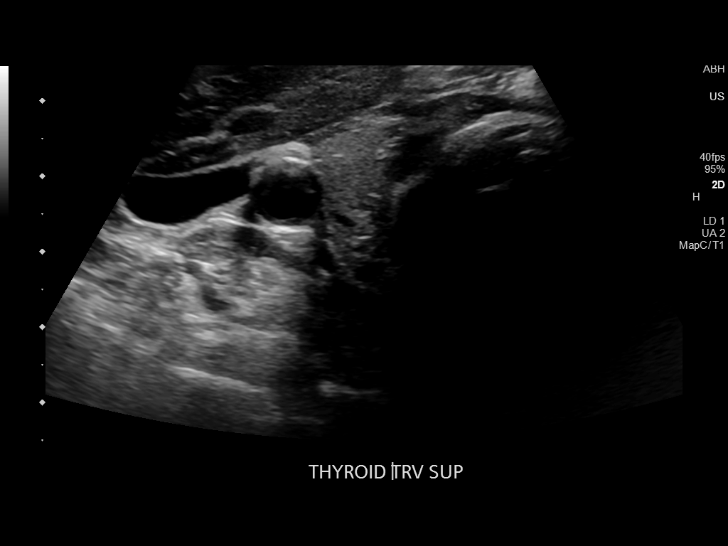
[im 27/79]
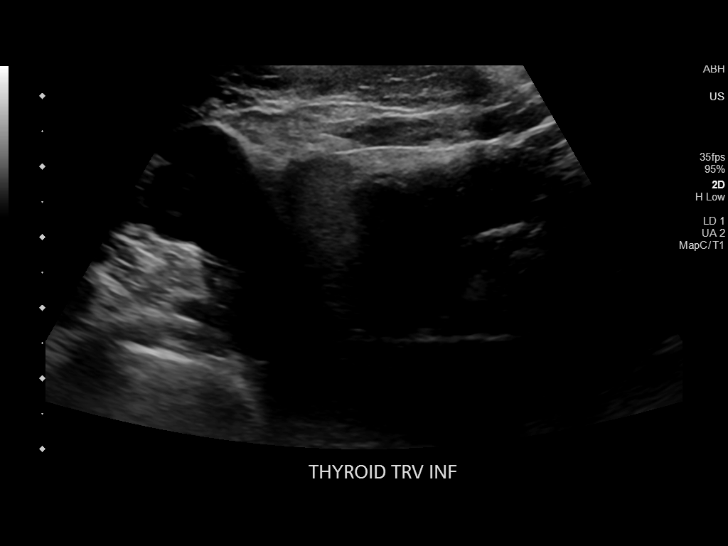
[im 33/79]
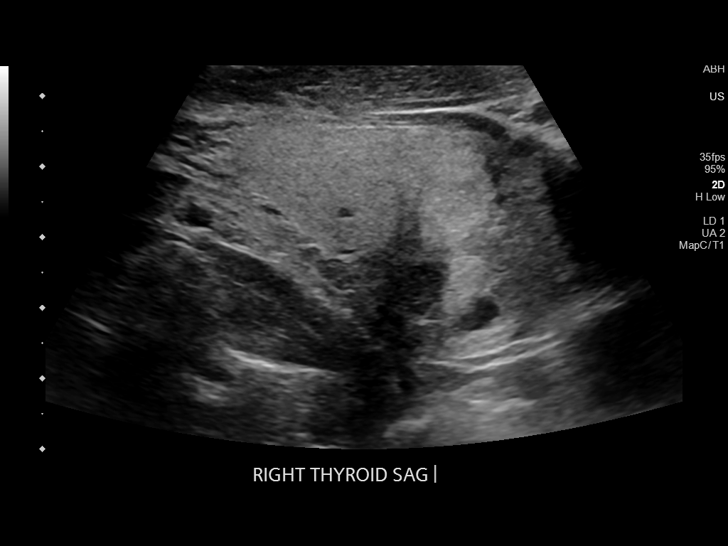
[im 40/79]
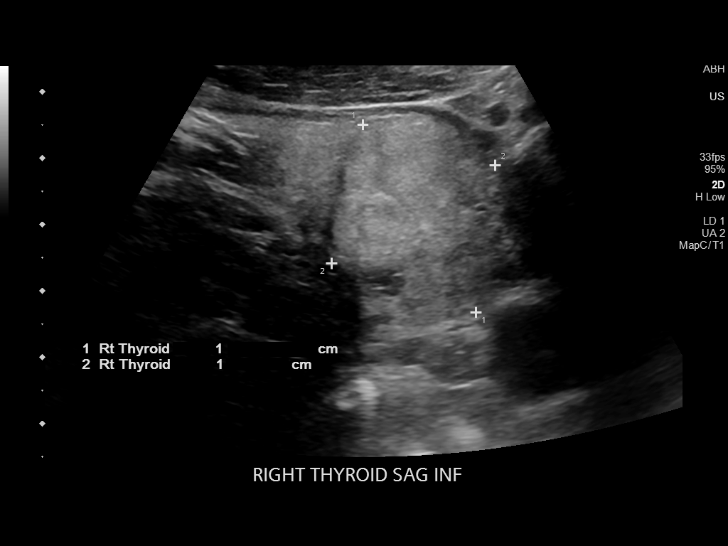
[im 46/79]
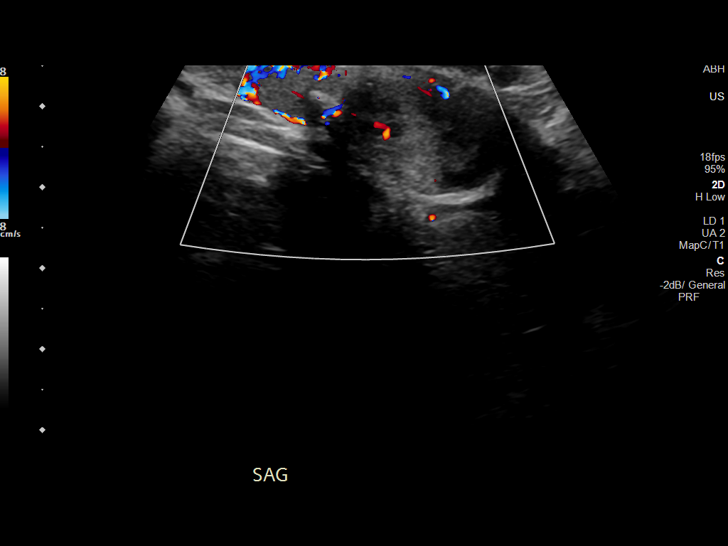
[im 53/79]
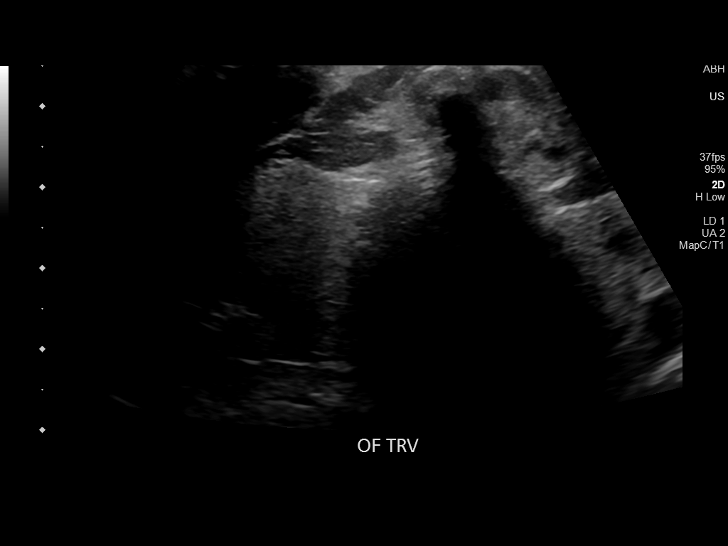
[im 59/79]
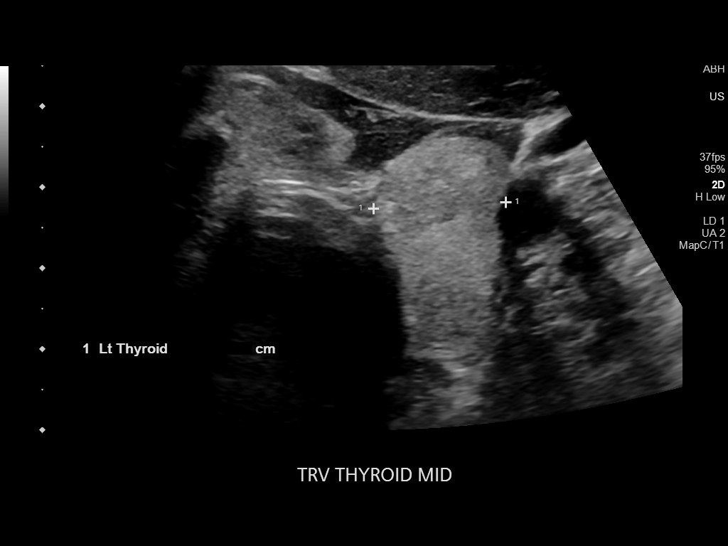
[im 66/79]
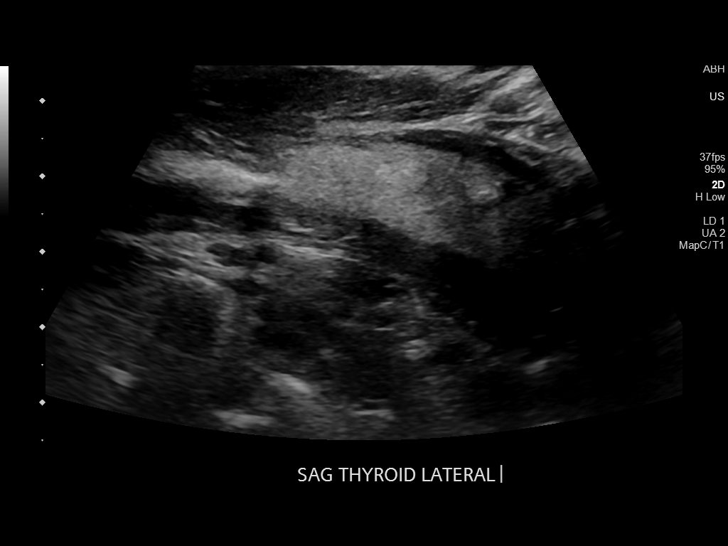
[im 72/79]
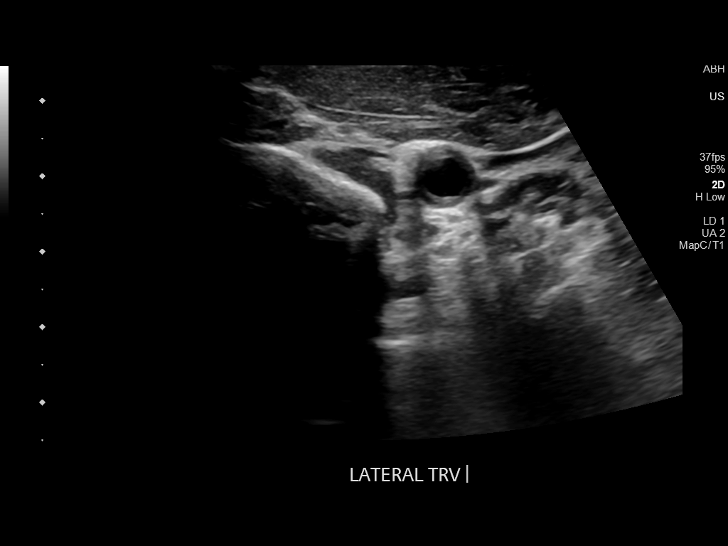
[im 79/79]
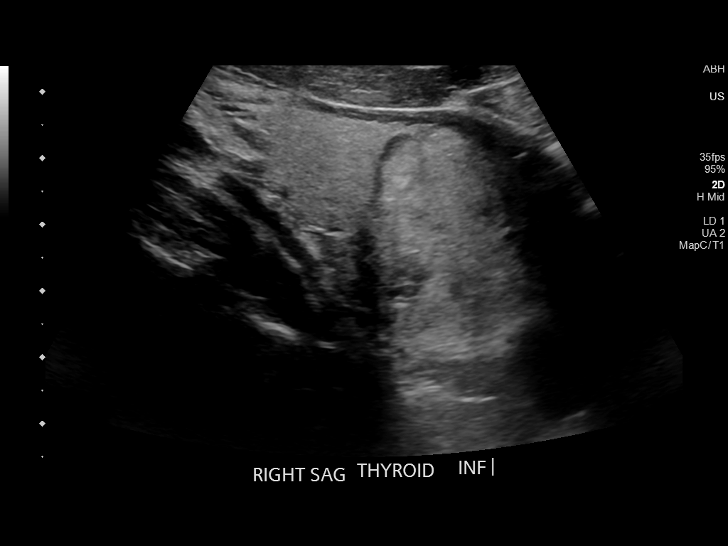

[13 of 25 positions shown; findings below may reference images not displayed]

FINDINGS: Parenchymal Echotexture: Moderately heterogenous

Isthmus: 1.6 cm

Right lobe: 5.9 x 3.1 x 1.9 cm

Left lobe: 4.8 x 2.0 x 1.6 cm

_________________________________________________________

Estimated total number of nodules >/= 1 cm: 2

Number of spongiform nodules >/=  2 cm not described below (TR1): 0

Number of mixed cystic and solid nodules >/= 1.5 cm not described
below (TR2): 0

_________________________________________________________

Nodule # 1:

Location: Isthmus; Mid

Maximum size: 3.0 cm; Other 2 dimensions: 1.5 x 2.4 cm

Composition: solid/almost completely solid (2)

Echogenicity: isoechoic (1)

Shape: not taller-than-wide (0)

Margins: ill-defined (0)

Echogenic foci: macrocalcifications (1)

ACR TI-RADS total points: 4.

ACR TI-RADS risk category: TR4 (4-6 points).

ACR TI-RADS recommendations:

**Given size (>/= 1.5 cm) and appearance, fine needle aspiration of
this moderately suspicious nodule should be considered based on
TI-RADS criteria.

_________________________________________________________

Nodule # 2:

Location: Right; Inferior

Maximum size: 3.4 cm; Other 2 dimensions: 3.3 x 2.9 cm

Composition: solid/almost completely solid (2)

Echogenicity: isoechoic (1)

Shape: not taller-than-wide (0)

Margins: ill-defined (0)

Echogenic foci: none (0)

ACR TI-RADS total points: 3.

ACR TI-RADS risk category: TR3 (3 points).

ACR TI-RADS recommendations:

**Given size (>/= 2.5 cm) and appearance, fine needle aspiration of
this mildly suspicious nodule should be considered based on TI-RADS
criteria.

_________________________________________________________
IMPRESSION: 3 cm mid isthmus TR 4 nodule meets criteria for biopsy.

3.4 cm right inferior TR 3 nodule also meets criteria for biopsy.
This nodule correlates with the chest CT finding.

The above is in keeping with the ACR TI-RADS recommendations - [HOSPITAL] 0723;[DATE].
